# Patient Record
Sex: Female | Born: 2000 | Race: Black or African American | Hispanic: No | Marital: Single | Smoking: Never smoker
Health system: Southern US, Community
[De-identification: ages and names within clinical notes are randomized; demographics above are authoritative.]

## PROBLEM LIST (undated history)

## (undated) DIAGNOSIS — J45909 Unspecified asthma, uncomplicated: Secondary | ICD-10-CM

## (undated) DIAGNOSIS — E282 Polycystic ovarian syndrome: Secondary | ICD-10-CM

## (undated) HISTORY — DX: Unspecified asthma, uncomplicated: J45.909

---
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
## 2018-01-02 ENCOUNTER — Ambulatory Visit: Payer: Self-pay | Admitting: Pediatrics

## 2018-01-10 ENCOUNTER — Encounter: Payer: Self-pay | Admitting: Pediatrics

## 2018-01-10 ENCOUNTER — Ambulatory Visit (INDEPENDENT_AMBULATORY_CARE_PROVIDER_SITE_OTHER): Payer: Medicaid Other | Admitting: Pediatrics

## 2018-01-10 VITALS — BP 115/75 | HR 67 | Ht 64.17 in | Wt 172.4 lb

## 2018-01-10 DIAGNOSIS — N946 Dysmenorrhea, unspecified: Secondary | ICD-10-CM | POA: Diagnosis not present

## 2018-01-10 DIAGNOSIS — L83 Acanthosis nigricans: Secondary | ICD-10-CM

## 2018-01-10 DIAGNOSIS — A749 Chlamydial infection, unspecified: Secondary | ICD-10-CM

## 2018-01-10 DIAGNOSIS — E282 Polycystic ovarian syndrome: Secondary | ICD-10-CM

## 2018-01-10 DIAGNOSIS — Z3202 Encounter for pregnancy test, result negative: Secondary | ICD-10-CM

## 2018-01-10 DIAGNOSIS — Z113 Encounter for screening for infections with a predominantly sexual mode of transmission: Secondary | ICD-10-CM

## 2018-01-10 LAB — POCT URINE PREGNANCY: PREG TEST UR: NEGATIVE

## 2018-01-10 MED ORDER — NORETHIN-ETH ESTRAD-FE BIPHAS 1 MG-10 MCG / 10 MCG PO TABS: 1.0000 | ORAL_TABLET | Freq: Every day | ORAL | 11 refills | Status: DC

## 2018-01-10 NOTE — Patient Instructions (Addendum)
Pick up vitamin D 2,000 IU daily  Restart birth control pill  I will send results to you   [email or web address]

## 2018-01-10 NOTE — Progress Notes (Signed)
History was provided by the patient.  Kristin Haynes is a 17 y.o. female who is here for .  Ettefagh, Aron Baba, MD   HPI:  Pt reports that she stopped taking the birth control because it was making her sad and cry a lot. The episodes stopped once she stopped birth control. It was all the time. LMP- had two in July- they were shorter than normal but closer together than normal as well. They were pretty heavy in the beginning. Cramping was still the same but tolerable with advil.   Did not pick up lovaza.   Finished vitamin D about 2 weeks ago.   Feels that she had eczema on her lips and after she took the vitamin D pills she feels it went away.   Sexually active. Used condom with last partner but he reports he also has chlamydia. She would like to be screened today and take medication if she needs it vs. Being treated in clinic today. She is asymptomatic.   Patient's last menstrual period was 01/01/2018.  Review of Systems  Constitutional: Negative for malaise/fatigue.  Eyes: Negative for double vision.  Respiratory: Negative for shortness of breath.   Cardiovascular: Negative for chest pain and palpitations.  Gastrointestinal: Negative for abdominal pain, constipation, diarrhea, nausea and vomiting.  Genitourinary: Negative for dysuria.  Musculoskeletal: Negative for joint pain and myalgias.  Skin: Negative for rash.  Neurological: Negative for dizziness and headaches.  Endo/Heme/Allergies: Does not bruise/bleed easily.    Patient Active Problem List   Diagnosis Date Noted  . Dysmenorrhea 10/05/2017  . Abdominal pain 03/31/2016  . Hyperlipidemia 01/06/2016  . Vitamin D deficiency 07/20/2015  . PCOS (polycystic ovarian syndrome) 07/18/2015  . Tension headache 05/03/2015  . Migraine without aura and without status migrainosus, not intractable 05/03/2015  . 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 Rhinitis, allergic 04/30/2014  . Mild intermittent asthma  04/30/2014  . Acanthosis nigricans 04/30/2014    Current Outpatient Medications on File Prior to Visit  Medication Sig Dispense Refill  . albuterol (PROVENTIL HFA;VENTOLIN HFA) 108 (90 Base) MCG/ACT inhaler Inhale 2 puffs into the lungs every 6 (six) hours as needed for wheezing or shortness of breath. 1 Inhaler 1  . cetirizine (ZYRTEC) 10 MG tablet Take 1 tablet (10 mg total) by mouth daily. 30 tablet 11  . ibuprofen (ADVIL,MOTRIN) 600 MG tablet TAKE 1 TABLET (600 MG TOTAL) BY MOUTH EVERY 6 (SIX) HOURS AS NEEDED FOR HEADACHE.  2   No current facility-administered medications on file prior to visit.     Allergies  Allergen Reactions  . Other     Seasonal Allergies       Social History: Confidentiality was discussed with the patient and if applicable, with caregiver as well. Tobacco: none Secondhand smoke exposure? no Drugs/EtOH: none Sexually active? yes - as above. Restarting OCP today.   Safety: safe at home and school  Last STI Screening: today Pregnancy Prevention: restart ocp and condoms   Physical Exam:    Vitals:   01/10/18 1556  BP: 115/75  Pulse: 67  Weight: 172 lb 6.4 oz (78.2 kg)  Height: 5' 4.17" (1.63 m)    Blood pressure percentiles are not available for patients who are 18 years or older.  Physical Exam  Constitutional: She is oriented to person, place, and time. She appears well-developed and well-nourished.  HENT:  Head: Normocephalic.  Neck: No thyromegaly present.  Cardiovascular: Normal rate, regular rhythm, normal heart sounds and intact distal pulses.  Pulmonary/Chest: Effort normal and breath sounds normal.  Abdominal: Soft. Bowel sounds are normal. There is no tenderness.  Musculoskeletal: Normal range of motion.  Neurological: She is alert and oriented to person, place, and time.  Skin: Skin is warm and dry.  Psychiatric: She has a normal mood and affect.    Assessment/Plan: 1. PCOS (polycystic ovarian syndrome) Not due for labs but  does need to take vit D daily. Discussed need for continued exercise and low fat diet with lipids. Does not want to take fish oil at this time.   2. Dysmenorrhea Elects to restart ocp despite past issues with abdominal pain. We will try lo loestrin. Based on manufacturer recommendations she meets weight criteria for efficacy. Discussed possible use of LARC vs. Patch/ring. She as interested in IUD and will let us know if she would like one.   3. Acanthosis nigricans Improving.   4. Routine screening for STI (sexually transmitted infection) Per protocol.  - C. trachomatis/N. gonorrhoeae RNA  5. Pregnancy examination or test, negative result Per protocol.  - POCT urine pregnancy  6. Chlamydia Chlamydia returnd positive. Patient treated. Declined EPT.  - azithromycin (ZITHROMAX) 500 MG tablet; Take 2 tablets (1,000 mg total) by mouth once for 1 dose.  Dispense: 2 tablet; Refill: 0   PCOS Labs & Referrals:   - Hgba1c annually if normal, every 3 months if abnormal:  Due 06/2018 - CMP annually if normal, as needed if abnormal:  Due 06/2018 - CBC if on metformin, annually if normal, as needed if abnormal: No metformin - Lipid every 2 years if normal, annually if abnormal:  Due 06/2018 - Vitamin D annually if normal, as needed if abnormal: Due 06/2018 - Nutrition referral: declined - BH Screening: Due next visit

## 2018-01-11 ENCOUNTER — Encounter: Payer: Self-pay | Admitting: Pediatrics

## 2018-01-11 LAB — C. TRACHOMATIS/N. GONORRHOEAE RNA
C. TRACHOMATIS RNA, TMA: DETECTED — AB
N. gonorrhoeae RNA, TMA: NOT DETECTED

## 2018-01-11 MED ORDER — AZITHROMYCIN 500 MG PO TABS: 1000.0000 mg | ORAL_TABLET | Freq: Once | ORAL | 0 refills | Status: AC

## 2018-02-13 ENCOUNTER — Ambulatory Visit (INDEPENDENT_AMBULATORY_CARE_PROVIDER_SITE_OTHER): Payer: Medicaid Other | Admitting: Family

## 2018-02-13 ENCOUNTER — Encounter: Payer: Self-pay | Admitting: Family

## 2018-02-13 VITALS — BP 117/89 | HR 56 | Ht 64.0 in | Wt 172.0 lb

## 2018-02-13 DIAGNOSIS — Z113 Encounter for screening for infections with a predominantly sexual mode of transmission: Secondary | ICD-10-CM

## 2018-02-13 NOTE — Progress Notes (Signed)
History was provided by the patient.  Kristin Haynes is a 17 y.o. female who is here for STI screening.   PCP confirmed? Yes.    Ettefagh, Aron Baba, MD  HPI:   -desires STI screening for test for reinfection. She was treated at last OV 01/10/18  -takes loestrin since 01/10/18 visit -not interested in IUD or LARC today, just wants screening  -no unexplained bleeding, no pain with intercourse -no lesions, no discharge changes   Review of Systems  Constitutional: Negative for malaise/fatigue.  Eyes: Negative for double vision.  Respiratory: Negative for shortness of breath.   Cardiovascular: Negative for chest pain and palpitations.  Gastrointestinal: Negative for abdominal pain, constipation, diarrhea, nausea and vomiting.  Genitourinary: Negative for dysuria.  Musculoskeletal: Negative for joint pain and myalgias.  Skin: Negative for rash.  Neurological: Negative for dizziness and headaches.  Endo/Heme/Allergies: Does not bruise/bleed easily.      Patient Active Problem List   Diagnosis Date Noted  . Dysmenorrhea 10/05/2017  . Abdominal pain 03/31/2016  . Hyperlipidemia 01/06/2016  . Vitamin D deficiency 07/20/2015  . PCOS (polycystic ovarian syndrome) 07/18/2015  . Tension headache 05/03/2015  . Migraine without aura and without status migrainosus, not intractable 05/03/2015  . 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 Rhinitis, allergic 04/30/2014  . Mild intermittent asthma 04/30/2014  . Acanthosis nigricans 04/30/2014    Current Outpatient Medications on File Prior to Visit  Medication Sig Dispense Refill  . albuterol (PROVENTIL HFA;VENTOLIN HFA) 108 (90 Base) MCG/ACT inhaler Inhale 2 puffs into the lungs every 6 (six) hours as needed for wheezing or shortness of breath. 1 Inhaler 1  . cetirizine (ZYRTEC) 10 MG tablet Take 1 tablet (10 mg total) by mouth daily. 30 tablet 11  . Norethindrone-Ethinyl Estradiol-Fe Biphas (LO LOESTRIN FE) 1 MG-10 MCG / 10  MCG tablet Take 1 tablet by mouth daily. 1 Package 11  . ibuprofen (ADVIL,MOTRIN) 600 MG tablet TAKE 1 TABLET (600 MG TOTAL) BY MOUTH EVERY 6 (SIX) HOURS AS NEEDED FOR HEADACHE.  2   No current facility-administered medications on file prior to visit.     Allergies  Allergen Reactions  . Other     Seasonal Allergies       Physical Exam:    Vitals:   02/13/18 0959  BP: 117/89  Pulse: (!) 56  Weight: 172 lb (78 kg)  Height: 5\' 4"  (1.626 m)   Wt Readings from Last 3 Encounters:  02/13/18 172 lb (78 kg) (92 %, Z= 1.43)*  01/10/18 172 lb 6.4 oz (78.2 kg) (93 %, Z= 1.45)*  10/03/17 178 lb (80.7 kg) (94 %, Z= 1.58)*   * Growth percentiles are based on CDC (Girls, 2-20 Years) data.    Blood pressure percentiles are not available for patients who are 18 years or older. No LMP recorded.  Physical Exam  Constitutional: She appears well-developed and well-nourished. No distress.  Eyes: Pupils are equal, round, and reactive to light. EOM are normal. No scleral icterus.  Neck: Normal range of motion. No thyromegaly present.  Cardiovascular: Normal rate and regular rhythm.  No murmur heard. Pulmonary/Chest: Effort normal.  Abdominal: Soft. There is no tenderness. There is no guarding.  Musculoskeletal: Normal range of motion. She exhibits no edema or tenderness.  Neurological: She is alert.  Skin: Skin is warm and dry. No rash noted.  Psychiatric: She has a normal mood and affect.  Nursing note and vitals reviewed.   Assessment/Plan: 1. Routine screening for STI (sexually  transmitted infection) testing today, will call with results  Condom use reviewed Also reviewed EC - GC Probe amplification, urine - RPR - HIV antibody

## 2018-02-13 NOTE — Patient Instructions (Signed)
I will call you with results from today's test.  Keep your scheduled appointment! It was nice to see you.

## 2018-02-14 LAB — HIV ANTIBODY (ROUTINE TESTING W REFLEX): HIV: NONREACTIVE

## 2018-02-14 LAB — RPR: RPR Ser Ql: NONREACTIVE

## 2018-02-16 ENCOUNTER — Encounter: Payer: Self-pay | Admitting: Family

## 2018-02-19 ENCOUNTER — Ambulatory Visit (INDEPENDENT_AMBULATORY_CARE_PROVIDER_SITE_OTHER): Payer: Medicaid Other

## 2018-02-19 DIAGNOSIS — Z113 Encounter for screening for infections with a predominantly sexual mode of transmission: Secondary | ICD-10-CM | POA: Diagnosis not present

## 2018-02-19 NOTE — Progress Notes (Signed)
Patient came to clinic for Gc/Chl urine specimen recollect. Patient made aware we will call when results are available.

## 2018-02-20 LAB — C. TRACHOMATIS/N. GONORRHOEAE RNA
C. trachomatis RNA, TMA: NOT DETECTED
N. gonorrhoeae RNA, TMA: NOT DETECTED

## 2018-03-04 ENCOUNTER — Encounter: Payer: Self-pay | Admitting: Family

## 2018-03-28 ENCOUNTER — Encounter: Payer: Self-pay | Admitting: Family

## 2018-04-01 ENCOUNTER — Encounter: Payer: Self-pay | Admitting: Pediatrics

## 2018-04-01 ENCOUNTER — Ambulatory Visit (INDEPENDENT_AMBULATORY_CARE_PROVIDER_SITE_OTHER): Payer: Medicaid Other | Admitting: Pediatrics

## 2018-04-01 VITALS — BP 122/74 | HR 67 | Ht 64.57 in | Wt 181.0 lb

## 2018-04-01 DIAGNOSIS — N644 Mastodynia: Secondary | ICD-10-CM

## 2018-04-01 DIAGNOSIS — G43009 Migraine without aura, not intractable, without status migrainosus: Secondary | ICD-10-CM

## 2018-04-01 DIAGNOSIS — N946 Dysmenorrhea, unspecified: Secondary | ICD-10-CM

## 2018-04-01 DIAGNOSIS — H538 Other visual disturbances: Secondary | ICD-10-CM

## 2018-04-01 DIAGNOSIS — E282 Polycystic ovarian syndrome: Secondary | ICD-10-CM

## 2018-04-01 DIAGNOSIS — N898 Other specified noninflammatory disorders of vagina: Secondary | ICD-10-CM

## 2018-04-01 DIAGNOSIS — Z113 Encounter for screening for infections with a predominantly sexual mode of transmission: Secondary | ICD-10-CM

## 2018-04-01 MED ORDER — NAPROXEN 500 MG PO TBEC: 500.0000 mg | DELAYED_RELEASE_TABLET | Freq: Two times a day (BID) | ORAL | 0 refills | Status: DC

## 2018-04-01 MED ORDER — CYCLOBENZAPRINE HCL 5 MG PO TABS: 5.0000 mg | ORAL_TABLET | Freq: Every day | ORAL | 0 refills | Status: DC

## 2018-04-01 NOTE — Patient Instructions (Signed)
Start taking naproxen 500 mg twice daily with food  Take flexeril 5 mg at bedtime One lab today  I will re-send you to adult neurology   If you have any loss of consciousness, weakness, difficulty with bowel or bladder, blindness in one eye, etc. Please go to the nearest ER

## 2018-04-01 NOTE — Progress Notes (Signed)
History was provided by the patient.  Kristin Haynes is a 17 y.o. female who is here for vaginal symptoms, headaches, breast pain.  Ettefagh, Aron Baba, MD   HPI:  Pt reports that she has a really bad migraine right now. She has been having them very frequently for hte last few weeks. She has not been sexually active since the last time she was here but the last time she had sex she had burning and some external pain. Continues to have regular discharge but an odor that is different. No pain with urination. Denies itching.   Bilateral breast pain. Didn't have any yesterday. Yesterday would have been a total of 5 days of it. It is like a zap of pain. She has never felt the sharp breast pain previously. None today. No discharge from nipples. No change in the skin.   Headaches are frontal behind eyes but mostly on the right side of her head. It can last all day and go to sleep and still wake up with it. Has some nausea but no vomiting. Feels like she needs to lay in the dark all day. Has blurry vision- this is new for her and only during parts of the day. Headache has been for about 1 week. Feels like she is hydrating well. She does not eat breakfast but otherwise doesn't skip meals. Goes to bed about 1-2 am and is up doing work on the computer. She has been awoken for two nights by twitching on the left side of her head. Nothing has made it better and it continues every few minutes. Just started drinking coffee which does help some. Has also had some other twitches around her body which is similar to the one in her head.   Has tried ibuprofen- 800 mg will stop the headache for a few hours but then it returns.   Patient's last menstrual period was 03/12/2018.  Review of Systems  Constitutional: Negative for malaise/fatigue.  HENT: Negative for hearing loss, sore throat and tinnitus.   Eyes: Positive for blurred vision and photophobia. Negative for double vision.  Respiratory: Negative for  shortness of breath.   Cardiovascular: Negative for chest pain and palpitations.  Gastrointestinal: Positive for nausea. Negative for abdominal pain, constipation, diarrhea and vomiting.  Genitourinary: Negative for dysuria.  Musculoskeletal: Negative for joint pain and myalgias.  Skin: Negative for rash.  Neurological: Positive for headaches. Negative for dizziness.  Endo/Heme/Allergies: Does not bruise/bleed easily.  Psychiatric/Behavioral: Negative for depression. The patient is not nervous/anxious.     Patient Active Problem List   Diagnosis Date Noted  . Dysmenorrhea 10/05/2017  . Abdominal pain 03/31/2016  . Hyperlipidemia 01/06/2016  . Vitamin D deficiency 07/20/2015  . PCOS (polycystic ovarian syndrome) 07/18/2015  . Tension headache 05/03/2015  . Migraine without aura and without status migrainosus, not intractable 05/03/2015  . 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 Rhinitis, allergic 04/30/2014  . Mild intermittent asthma 04/30/2014  . Acanthosis nigricans 04/30/2014    Current Outpatient Medications on File Prior to Visit  Medication Sig Dispense Refill  . albuterol (PROVENTIL HFA;VENTOLIN HFA) 108 (90 Base) MCG/ACT inhaler Inhale 2 puffs into the lungs every 6 (six) hours as needed for wheezing or shortness of breath. 1 Inhaler 1  . Norethindrone-Ethinyl Estradiol-Fe Biphas (LO LOESTRIN FE) 1 MG-10 MCG / 10 MCG tablet Take 1 tablet by mouth daily. 1 Package 11  . ibuprofen (ADVIL,MOTRIN) 600 MG tablet TAKE 1 TABLET (600 MG TOTAL) BY MOUTH EVERY 6 (SIX)  HOURS AS NEEDED FOR HEADACHE.  2   No current facility-administered medications on file prior to visit.     Allergies  Allergen Reactions  . Other     Seasonal Allergies       Social History: Confidentiality was discussed with the patient and if applicable, with caregiver as well. Tobacco: none Secondhand smoke exposure? no Drugs/EtOH: none Sexually active? yes  Safety: safe to self and at  home Last STI Screening: today Pregnancy Prevention: OCP  Physical Exam:    Vitals:   04/01/18 1437  BP: 122/74  Pulse: 67  Weight: 181 lb (82.1 kg)  Height: 5' 4.57" (1.64 m)    Blood pressure percentiles are not available for patients who are 18 years or older.  Physical Exam  Constitutional: She is oriented to person, place, and time. She appears well-developed. No distress.  HENT:  Mouth/Throat: Oropharynx is clear and moist.  Eyes: Pupils are equal, round, and reactive to light. EOM are normal.  Neck: No thyromegaly present.  Cardiovascular: Normal rate and regular rhythm.  No murmur heard. Pulmonary/Chest: Breath sounds normal.  Abdominal: Soft. She exhibits no mass. There is no tenderness. There is no guarding.  Musculoskeletal: She exhibits no edema.  Lymphadenopathy:    She has no cervical adenopathy.  Neurological: She is alert and oriented to person, place, and time. No cranial nerve deficit or sensory deficit. Coordination normal.  Skin: Skin is warm. No rash noted.  Psychiatric: She has a normal mood and affect.  Nursing note and vitals reviewed.   Assessment/Plan: 1. PCOS (polycystic ovarian syndrome) Doing well on OCP.   2. Dysmenorrhea Still having some cramping but overall ok with OCP as it is now. Does not desire a LARC at this time.   3. Migraine without aura and without status migrainosus, not intractable Given breast pain and headaches will check prolactin. Muscle fasciculations are likely benign and possibly stress related. She will take 5 mg flexeril at bedtime to help with muscle relaxation to get good sleep and hopefully break headache with naproxen 500 mg BID. She saw neuro 3 years ago- now is 2 so will re-refer to adult neuro given that migraines seem to have worsened. No overt need for imaging today.  - Prolactin - naproxen (EC NAPROSYN) 500 MG EC tablet; Take 1 tablet (500 mg total) by mouth 2 (two) times daily with a meal.  Dispense: 60  tablet; Refill: 0 - cyclobenzaprine (FLEXERIL) 5 MG tablet; Take 1 tablet (5 mg total) by mouth at bedtime.  Dispense: 15 tablet; Refill: 0 - Ambulatory referral to Neurology  4. Breast pain Expect related to menstrual cycle. Discussed alarming things like skin changes, lumps or nipple discharge. Did not do full exam today given that we were drawing prolactin.  - Prolactin  5. Vaginal discharge Will treat accordingly.  - WET PREP BY MOLECULAR PROBE  6. Routine screening for STI (sexually transmitted infection) Given new partner and sx, will screen  - C. trachomatis/N. gonorrhoeae RNA  7. Blurry vision, right eye Likely migraine related, but neuro should see her again to help eval further. Discussed ED return precautions.  - Prolactin - Ambulatory referral to Neurology

## 2018-04-02 ENCOUNTER — Other Ambulatory Visit: Payer: Self-pay | Admitting: Pediatrics

## 2018-04-02 DIAGNOSIS — B9689 Other specified bacterial agents as the cause of diseases classified elsewhere: Secondary | ICD-10-CM

## 2018-04-02 DIAGNOSIS — N760 Acute vaginitis: Principal | ICD-10-CM

## 2018-04-02 LAB — WET PREP BY MOLECULAR PROBE
CANDIDA SPECIES: NOT DETECTED
MICRO NUMBER:: 91262338
SPECIMEN QUALITY:: ADEQUATE
Trichomonas vaginosis: NOT DETECTED

## 2018-04-02 LAB — C. TRACHOMATIS/N. GONORRHOEAE RNA
C. trachomatis RNA, TMA: NOT DETECTED
N. GONORRHOEAE RNA, TMA: NOT DETECTED

## 2018-04-02 LAB — PROLACTIN: Prolactin: 9 ng/mL

## 2018-04-02 MED ORDER — METRONIDAZOLE 500 MG PO TABS: 500.0000 mg | ORAL_TABLET | Freq: Two times a day (BID) | ORAL | 0 refills | Status: AC

## 2018-04-17 ENCOUNTER — Ambulatory Visit: Payer: Self-pay | Admitting: Pediatrics

## 2018-04-24 ENCOUNTER — Ambulatory Visit (INDEPENDENT_AMBULATORY_CARE_PROVIDER_SITE_OTHER): Payer: Medicaid Other | Admitting: Family

## 2018-04-24 ENCOUNTER — Other Ambulatory Visit: Payer: Self-pay | Admitting: Pediatrics

## 2018-04-24 VITALS — BP 114/76 | HR 60 | Ht 64.17 in | Wt 179.4 lb

## 2018-04-24 DIAGNOSIS — G43009 Migraine without aura, not intractable, without status migrainosus: Secondary | ICD-10-CM | POA: Diagnosis not present

## 2018-04-24 DIAGNOSIS — E282 Polycystic ovarian syndrome: Secondary | ICD-10-CM

## 2018-04-24 DIAGNOSIS — H538 Other visual disturbances: Secondary | ICD-10-CM | POA: Diagnosis not present

## 2018-04-24 DIAGNOSIS — Z3202 Encounter for pregnancy test, result negative: Secondary | ICD-10-CM | POA: Diagnosis not present

## 2018-04-24 LAB — POCT URINE PREGNANCY: PREG TEST UR: NEGATIVE

## 2018-04-24 NOTE — Patient Instructions (Signed)
You are constipated and need help to clean out the large amount of stool (poop) in the intestine. This guide tells you what medicine to use.  What do I need to know before starting the clean out?  . It will take about 4 to 6 hours to take the medicine.  . After taking the medicine, you should have a large stool within 24 hours.  . Plan to stay close to a bathroom until the stool has passed. . After the intestine is cleaned out, you will need to take a daily medicine.   Remember:  Constipation can last a long time. It may take 6 to 12 months for you to get back to regular bowel movements (BMs). Be patient. Things will get better slowly over time.  If you have questions, call your doctor at this number:     [phone]   When should you start the clean out?  . Start the home clean out on a Friday afternoon or some other time when you will be home (and not at school).  . Start between 2:00 and 4:00 in the afternoon.  . You should have almost clear liquid stools by the end of the next day. . If the medicine does not work or you don't know if it worked, call your doctor or nurse.  What medicine do I need to take?  You need to take Miralax, a powder that you mix in a clear liquid.  Follow these steps: ?    Stir the Miralax powder into water, juice, or Gatorade. Your Miralax dose is: 8 capfuls of Miralax powder in 32 ounces of liquid ?    Drink 4 to 8 ounces every 30 minutes. It will take 4 to 6 hours to finish the medicine. ?    After the medicine is gone, drink more water or juice. This will help with the cleanout.   -     If the medicine gives you an upset stomach, slow down or stop.   Does I need to keep taking medicine?                                                                                                      After the clean out, you will take a daily (maintenance) medicine for at least 6 months. Your Miralax dose is:      1 capful of powder in 8 ounces of liquid  every day   You should go to the doctor for follow-up appointments as directed.  What if I get constipated again?  Some people need to have the clean out more than one time for the problem to go away. Contact your doctor to ask if you should repeat the clean out. It is OK to do it again, but you should wait at least a week before repeating the clean out.    Will I have any problems with the medicine?   You may have stomach pain or cramping during the clean out. This might mean you have to go to the bathroom.     Take some time to sit on the toilet. The pain will go away when the stool is gone. You may want to read while you wait. A warm bath may also help.   What should I eat and drink?  Drink lots of water and juice. Fruits and vegetables are good foods to eat. Try to avoid greasy and fatty foods.   

## 2018-04-24 NOTE — Progress Notes (Signed)
History was provided by the patient.  Kristin Haynes is a 17 y.o. female who is here for follow up on dysmenorrhea, headaches, PCOS, and blurry vision.  PCP confirmed? Yes.    Ettefagh, Aron BabaKate Scott, MD  HPI:   -prolactin level was normal after last OV -she was referred to neuro for migraines, mostly R eye blurry -still having symptoms, headaches.  -she cannot recall last vision screening  -wants to keep taking OCPs and naprosyn for cramps   Review of Systems  Constitutional: Negative for chills and fever.  HENT: Negative for sore throat.   Eyes: Positive for blurred vision. Negative for pain.  Respiratory: Negative for cough.   Gastrointestinal: Negative for abdominal pain and vomiting.  Genitourinary: Negative for dysuria and frequency.  Musculoskeletal: Negative for myalgias.  Skin: Negative for rash.  Neurological: Positive for headaches. Negative for dizziness, tingling and tremors.  Psychiatric/Behavioral: Negative for depression, hallucinations, substance abuse and suicidal ideas. The patient is not nervous/anxious.      Patient Active Problem List   Diagnosis Date Noted  . Dysmenorrhea 10/05/2017  . Abdominal pain 03/31/2016  . Hyperlipidemia 01/06/2016  . Vitamin D deficiency 07/20/2015  . PCOS (polycystic ovarian syndrome) 07/18/2015  . Tension headache 05/03/2015  . Migraine without aura and without status migrainosus, not intractable 05/03/2015  . 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 Rhinitis, allergic 04/30/2014  . Mild intermittent asthma 04/30/2014  . Acanthosis nigricans 04/30/2014    Current Outpatient Medications on File Prior to Visit  Medication Sig Dispense Refill  . albuterol (PROVENTIL HFA;VENTOLIN HFA) 108 (90 Base) MCG/ACT inhaler Inhale 2 puffs into the lungs every 6 (six) hours as needed for wheezing or shortness of breath. 1 Inhaler 1  . Norethindrone-Ethinyl Estradiol-Fe Biphas (LO LOESTRIN FE) 1 MG-10 MCG / 10 MCG tablet Take  1 tablet by mouth daily. 1 Package 11  . cyclobenzaprine (FLEXERIL) 5 MG tablet Take 1 tablet (5 mg total) by mouth at bedtime. (Patient not taking: Reported on 04/24/2018) 15 tablet 0  . ibuprofen (ADVIL,MOTRIN) 600 MG tablet TAKE 1 TABLET (600 MG TOTAL) BY MOUTH EVERY 6 (SIX) HOURS AS NEEDED FOR HEADACHE.  2   No current facility-administered medications on file prior to visit.     Allergies  Allergen Reactions  . Other     Seasonal Allergies       Physical Exam:    Vitals:   04/24/18 1351  BP: 114/76  Pulse: 60  Weight: 179 lb 6.4 oz (81.4 kg)  Height: 5' 4.17" (1.63 m)   Wt Readings from Last 3 Encounters:  04/24/18 179 lb 6.4 oz (81.4 kg) (94 %, Z= 1.58)*  04/01/18 181 lb (82.1 kg) (95 %, Z= 1.62)*  02/13/18 172 lb (78 kg) (92 %, Z= 1.43)*   * Growth percentiles are based on CDC (Girls, 2-20 Years) data.     Blood pressure percentiles are not available for patients who are 18 years or older. No LMP recorded.  Physical Exam Vitals signs and nursing note reviewed.  Constitutional:      General: She is not in acute distress.    Appearance: She is well-developed.  HENT:     Mouth/Throat:     Mouth: Mucous membranes are moist.     Pharynx: No posterior oropharyngeal erythema.  Eyes:     General: No scleral icterus.       Right eye: No discharge.        Left eye: No discharge.  Extraocular Movements: Extraocular movements intact.     Conjunctiva/sclera: Conjunctivae normal.     Pupils: Pupils are equal, round, and reactive to light.  Neck:     Musculoskeletal: No neck rigidity.     Thyroid: No thyromegaly.     Vascular: No carotid bruit.  Cardiovascular:     Rate and Rhythm: Normal rate and regular rhythm.     Heart sounds: No murmur.  Pulmonary:     Breath sounds: Normal breath sounds.  Lymphadenopathy:     Cervical: No cervical adenopathy.  Skin:    General: Skin is warm.     Capillary Refill: Capillary refill takes 2 to 3 seconds.     Findings:  No rash.  Neurological:     General: No focal deficit present.     Mental Status: She is alert and oriented to person, place, and time.  Psychiatric:        Mood and Affect: Mood normal.     Assessment/Plan: 1. Blurry vision, bilateral -could be vision related headaches, will send to ophthalmology for exam -reassurance given re: normal prolactin level   - Ambulatory referral to Ophthalmology  2. Migraine without aura and without status migrainosus, not intractable -continue with neuro scheduled for 12/17   3. PCOS (polycystic ovarian syndrome) -continue on current regimen of Lo Loestrin and Naproxen EC 500 mg BID with meals for cramps -cautioned NSAID use and rebound headaches  4. Negative pregnancy test Negative  - POCT urine pregnancy

## 2018-05-26 ENCOUNTER — Encounter: Payer: Self-pay | Admitting: Family

## 2018-05-28 ENCOUNTER — Ambulatory Visit: Payer: Medicaid Other | Admitting: Neurology

## 2018-06-18 ENCOUNTER — Other Ambulatory Visit: Payer: Self-pay | Admitting: Family

## 2018-06-18 MED ORDER — NORETHIN-ETH ESTRAD-FE BIPHAS 1 MG-10 MCG / 10 MCG PO TABS: 1.0000 | ORAL_TABLET | Freq: Every day | ORAL | 11 refills | Status: DC

## 2018-07-22 ENCOUNTER — Ambulatory Visit: Payer: Medicaid Other | Admitting: Neurology

## 2018-07-22 ENCOUNTER — Telehealth: Payer: Self-pay | Admitting: *Deleted

## 2018-07-22 NOTE — Telephone Encounter (Signed)
No showed new patient appointment. 

## 2018-07-23 ENCOUNTER — Encounter: Payer: Self-pay | Admitting: Neurology

## 2018-08-22 DIAGNOSIS — G43009 Migraine without aura, not intractable, without status migrainosus: Secondary | ICD-10-CM

## 2018-08-23 MED ORDER — CYCLOBENZAPRINE HCL 5 MG PO TABS: 5.0000 mg | ORAL_TABLET | Freq: Every day | ORAL | 0 refills | Status: DC

## 2018-09-15 ENCOUNTER — Other Ambulatory Visit: Payer: Self-pay | Admitting: Pediatrics

## 2018-09-15 DIAGNOSIS — G43009 Migraine without aura, not intractable, without status migrainosus: Secondary | ICD-10-CM

## 2019-03-11 ENCOUNTER — Telehealth: Payer: Self-pay

## 2019-03-11 NOTE — Telephone Encounter (Signed)
Pt called asking for refill of BV medication, Flexeril, and birth control. Called number on file, no answer, left VM to call office back to schedule follow up. Also re-sent mychart activation text for patient.

## 2019-04-14 ENCOUNTER — Emergency Department (HOSPITAL_COMMUNITY)
Admission: EM | Admit: 2019-04-14 | Discharge: 2019-04-14 | Disposition: A | Discharge status: Home / Self Care | Payer: Self-pay | Attending: Emergency Medicine | Admitting: Emergency Medicine

## 2019-04-14 ENCOUNTER — Other Ambulatory Visit: Payer: Self-pay

## 2019-04-14 ENCOUNTER — Encounter (HOSPITAL_COMMUNITY): Payer: Self-pay

## 2019-04-14 DIAGNOSIS — Z79899 Other long term (current) drug therapy: Secondary | ICD-10-CM | POA: Insufficient documentation

## 2019-04-14 DIAGNOSIS — J45909 Unspecified asthma, uncomplicated: Secondary | ICD-10-CM | POA: Insufficient documentation

## 2019-04-14 DIAGNOSIS — Z202 Contact with and (suspected) exposure to infections with a predominantly sexual mode of transmission: Secondary | ICD-10-CM | POA: Insufficient documentation

## 2019-04-14 LAB — URINALYSIS, ROUTINE W REFLEX MICROSCOPIC
Bilirubin Urine: NEGATIVE
Glucose, UA: NEGATIVE mg/dL
Ketones, ur: NEGATIVE mg/dL
Leukocytes,Ua: NEGATIVE
Nitrite: NEGATIVE
Protein, ur: NEGATIVE mg/dL
Specific Gravity, Urine: 1.016 (ref 1.005–1.030)
pH: 6 (ref 5.0–8.0)

## 2019-04-14 LAB — WET PREP, GENITAL
Sperm: NONE SEEN
Trich, Wet Prep: NONE SEEN
Yeast Wet Prep HPF POC: NONE SEEN

## 2019-04-14 LAB — POC URINE PREG, ED: Preg Test, Ur: NEGATIVE

## 2019-04-14 MED ORDER — AZITHROMYCIN 250 MG PO TABS
1000.0000 mg | ORAL_TABLET | Freq: Once | ORAL | Status: AC
  Administered 2019-04-14: 1000 mg via ORAL
  Filled 2019-04-14: qty 4

## 2019-04-14 MED ORDER — LIDOCAINE HCL (PF) 1 % IJ SOLN
INTRAMUSCULAR | Status: AC
  Administered 2019-04-14: 1 mL
  Filled 2019-04-14: qty 2

## 2019-04-14 MED ORDER — CEFTRIAXONE SODIUM 250 MG IJ SOLR
250.0000 mg | Freq: Once | INTRAMUSCULAR | Status: AC
  Administered 2019-04-14: 250 mg via INTRAMUSCULAR
  Filled 2019-04-14: qty 250

## 2019-04-14 NOTE — ED Triage Notes (Addendum)
Pt reports had sex approx 1 week ago and wants to be checked for STD and pregnancy.   Reports had some spotting recently.

## 2019-04-14 NOTE — Discharge Instructions (Addendum)
Some of your culture results are still pending.  You will be notified of any positive results.  You may also review your results in mychart.  You may follow-up with the health department or [facility] if needed.

## 2019-04-14 NOTE — ED Provider Notes (Signed)
[facility] EMERGENCY DEPARTMENT Provider Note   CSN: [id number] Arrival date & time: 04/14/19  1022     History   Chief Complaint Chief Complaint  Patient presents with  . S74.5    HPI Kristin Haynes is a 18 y.o. female.     HPI   Kristin Haynes is a 18 y.o. female who presents to the Emergency Department requesting evaluation for possible STD.  She reports having unprotected sex 1 week ago with a new sexual partner.  She reports some intermittent vaginal spotting and vaginal odor.  No excessive discharge, dysuria, abdominal pain, fever or chills.  She is not taken an over-the-counter pregnancy test, but is requesting to be evaluated for possible pregnancy as well.  Past Medical History:  Diagnosis Date  . Asthma    cough-variant asthma    Patient Active Problem List   Diagnosis Date Noted  . Dysmenorrhea 10/05/2017  . Abdominal pain 03/31/2016  . Hyperlipidemia 01/06/2016  . Vitamin D deficiency 07/20/2015  . PCOS (polycystic ovarian syndrome) 07/18/2015  . Tension headache 05/03/2015  . Migraine without aura and without status migrainosus, not intractable 05/03/2015  . 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 Rhinitis, allergic 04/30/2014  . Mild intermittent asthma 04/30/2014  . Acanthosis nigricans 04/30/2014    History reviewed. No pertinent surgical history.   OB History   No obstetric history on file.      Home Medications    Prior to Admission medications   Medication Sig Start Date End Date Taking? Authorizing Provider  albuterol (PROVENTIL HFA;VENTOLIN HFA) 108 (90 Base) MCG/ACT inhaler Inhale 2 puffs into the lungs every 6 (six) hours as needed for wheezing or shortness of breath. 07/16/15   Ettefagh, Aron Baba, MD  cyclobenzaprine (FLEXERIL) 5 MG tablet TAKE 1 TABLET BY MOUTH EVERYDAY AT BEDTIME 09/16/18   Alfonso Ramus T, FNP  ibuprofen (ADVIL,MOTRIN) 600 MG tablet TAKE 1 TABLET (600 MG TOTAL) BY MOUTH EVERY 6 (SIX) HOURS AS  NEEDED FOR HEADACHE. 03/18/16   [provider]  NAPROXEN DR 500 MG EC tablet TAKE 1 TABLET BY MOUTH TWICE A DAY WITH MEALS 04/24/18   Verneda Skill, FNP  Norethindrone-Ethinyl Estradiol-Fe Biphas (LO LOESTRIN FE) 1 MG-10 MCG / 10 MCG tablet Take 1 tablet by mouth daily. 06/18/18   Georges Mouse, NP    Family History Family History  Problem Relation Age of Onset  . Hyperlipidemia Mother   . Migraines Mother   . Asthma Maternal Grandmother   . Diabetes Maternal Grandmother   . Heart disease Maternal Grandmother        congestive heart failure  . Cancer Maternal Grandmother        breast cancer (negative testing for genetic causes)  . Heart disease Maternal Grandfather 42       MI  . Migraines Maternal Grandfather   . Asthma Sister   . Seizures Other        2 paternal 1st cousins have seizures, maternal 1st cousin had febrile seizures (resolved), MGU has seizures    Social History Social History   Tobacco Use  . Smoking status: Never Smoker  . Smokeless tobacco: Never Used  Substance Use Topics  . Alcohol use: No    Alcohol/week: 0.0 standard drinks  . Drug use: No     Allergies   Other   Review of Systems Review of Systems  Constitutional: Negative for activity change, appetite change, chills and fever.  Respiratory: Negative for chest tightness and  shortness of breath.   Cardiovascular: Negative for chest pain.  Gastrointestinal: Negative for abdominal pain, nausea and vomiting.  Genitourinary: Positive for urgency and vaginal bleeding ("Spotting"). Negative for decreased urine volume, difficulty urinating, dysuria, flank pain, frequency, genital sores, hematuria, vaginal discharge and vaginal pain.  Musculoskeletal: Negative for back pain.  Skin: Negative for rash.  Neurological: Negative for dizziness, weakness and numbness.  Hematological: Negative for adenopathy.     Physical Exam Updated Vital Signs BP 132/86 (BP Location: Right Arm)    Pulse (!) 58   Temp 98.4 F (36.9 C) (Oral)   Resp 18   Ht 5\' 4"  (1.626 m)   Wt 84.4 kg   LMP 03/18/2019   SpO2 100%   BMI 31.93 kg/m   Physical Exam Vitals signs and nursing note reviewed. Exam conducted with a chaperone present.  Constitutional:      Appearance: Normal appearance. She is not ill-appearing or toxic-appearing.  Cardiovascular:     Rate and Rhythm: Normal rate and regular rhythm.  Pulmonary:     Effort: Pulmonary effort is normal.     Breath sounds: Normal breath sounds.  Abdominal:     General: There is no distension.     Palpations: Abdomen is soft.     Tenderness: There is no abdominal tenderness.  Genitourinary:    Vagina: No vaginal discharge.     Cervix: No cervical motion tenderness, discharge, friability, lesion or cervical bleeding.     Uterus: Normal. Not tender.      Adnexa: Right adnexa normal and left adnexa normal.       Right: No mass or tenderness.         Left: No mass or tenderness.    Musculoskeletal: Normal range of motion.        General: No tenderness.  Skin:    General: Skin is warm.     Findings: No rash.  Neurological:     General: No focal deficit present.     Mental Status: She is alert.     Motor: No weakness.      ED Treatments / Results  Labs (all labs ordered are listed, but only abnormal results are displayed) Labs Reviewed  WET PREP, GENITAL - Abnormal; Notable for the following components:      Result Value   Clue Cells Wet Prep HPF POC PRESENT (*)    WBC, Wet Prep HPF POC MODERATE (*)    All other components within normal limits  URINALYSIS, ROUTINE W REFLEX MICROSCOPIC - Abnormal; Notable for the following components:   APPearance HAZY (*)    Hgb urine dipstick MODERATE (*)    Bacteria, UA MANY (*)    All other components within normal limits  POC URINE PREG, ED  GC/CHLAMYDIA PROBE AMP ([place]    EKG None  Radiology No results found.  Procedures Procedures (including critical  care time)  Medications Ordered in ED Medications  azithromycin (ZITHROMAX) tablet 1,000 mg (1,000 mg Oral Given 04/14/19 1215)  cefTRIAXone (ROCEPHIN) injection 250 mg (250 mg Intramuscular Given 04/14/19 1215)  lidocaine (PF) (XYLOCAINE) 1 % injection (1 mL  Given 04/14/19 1215)     Initial Impression / Assessment and Plan / ED Course  I have reviewed the triage vital signs and the nursing notes.  Pertinent labs & imaging results that were available during my care of the patient were reviewed by me and considered in my medical decision making (see chart for details).  Patient here for evaluation of possible STD exposure.  She is requesting prophylactic treatment.  Gonorrhea and Chlamydia cultures are pending.  No concerning symptoms for PID or TOA.  Patient appears appropriate for discharge home, treated here with Rocephin and Zithromax.  She understands that she will be contacted for any positive culture results.  F/u information provided for the health department.  Final Clinical Impressions(s) / ED Diagnoses   Final diagnoses:  STD exposure    ED Discharge Orders    None       Kem Parkinson, PA-C 04/16/19 1351    Nat Christen, MD 04/18/19 [phone]

## 2019-04-15 LAB — GC/CHLAMYDIA PROBE AMP (~~LOC~~) NOT AT ARMC
Chlamydia: NEGATIVE
Neisseria Gonorrhea: NEGATIVE

## 2019-05-05 ENCOUNTER — Other Ambulatory Visit: Payer: Self-pay

## 2019-05-05 DIAGNOSIS — G43009 Migraine without aura, not intractable, without status migrainosus: Secondary | ICD-10-CM

## 2019-05-06 MED ORDER — CYCLOBENZAPRINE HCL 5 MG PO TABS: 5.0000 mg | ORAL_TABLET | ORAL | 0 refills | Status: DC | PRN

## 2019-06-19 ENCOUNTER — Other Ambulatory Visit: Payer: Self-pay | Admitting: Pediatrics

## 2019-08-07 ENCOUNTER — Other Ambulatory Visit (HOSPITAL_COMMUNITY)
Admission: RE | Admit: 2019-08-07 | Discharge: 2019-08-07 | Disposition: A | Discharge status: Home / Self Care | Payer: Self-pay | Source: Ambulatory Visit | Attending: Family | Admitting: Family

## 2019-08-07 ENCOUNTER — Ambulatory Visit (INDEPENDENT_AMBULATORY_CARE_PROVIDER_SITE_OTHER): Payer: Self-pay

## 2019-08-07 ENCOUNTER — Other Ambulatory Visit: Payer: Self-pay

## 2019-08-07 ENCOUNTER — Other Ambulatory Visit: Payer: Self-pay | Admitting: Family

## 2019-08-07 DIAGNOSIS — M62838 Other muscle spasm: Secondary | ICD-10-CM

## 2019-08-07 DIAGNOSIS — N898 Other specified noninflammatory disorders of vagina: Secondary | ICD-10-CM

## 2019-08-07 NOTE — Addendum Note (Signed)
Addended by: Alycia Patten on: 08/07/2019 03:42 PM   Modules accepted: Orders

## 2019-08-07 NOTE — Progress Notes (Signed)
Pt comes to clinic with chief complaint of vaginal discharge and odor. Will screen today for STI and BV. Swab collected successfully and sent to lab. She also is concerned about muscle spasms and requests labs to rule out deficiency. Pt due for PAP. Appointment made. Will follow up with provider at onsite visit March 18th for muscle spasms.

## 2019-08-07 NOTE — Addendum Note (Signed)
Addended by: Alycia Patten on: 08/07/2019 03:43 PM   Modules accepted: Orders

## 2019-08-08 ENCOUNTER — Other Ambulatory Visit: Payer: Self-pay | Admitting: Family

## 2019-08-08 DIAGNOSIS — E559 Vitamin D deficiency, unspecified: Secondary | ICD-10-CM

## 2019-08-08 LAB — THYROID PANEL WITH TSH
Free Thyroxine Index: 2.2 (ref 1.4–3.8)
T3 Uptake: 27 % (ref 22–35)
T4, Total: 8.2 ug/dL (ref 5.1–11.9)
TSH: 2.06 mIU/L

## 2019-08-08 LAB — URINE CYTOLOGY ANCILLARY ONLY
Chlamydia: NEGATIVE
Comment: NEGATIVE
Comment: NORMAL
Neisseria Gonorrhea: NEGATIVE

## 2019-08-08 LAB — WET PREP BY MOLECULAR PROBE
Candida species: NOT DETECTED
Gardnerella vaginalis: NOT DETECTED
MICRO NUMBER:: 10188405
SPECIMEN QUALITY:: ADEQUATE
Trichomonas vaginosis: NOT DETECTED

## 2019-08-08 LAB — B12 AND FOLATE PANEL
Folate: 16.6 ng/mL
Vitamin B-12: 413 pg/mL (ref 200–1100)

## 2019-08-08 LAB — BASIC METABOLIC PANEL
BUN: 9 mg/dL (ref 7–25)
CO2: 22 mmol/L (ref 20–32)
Calcium: 9.5 mg/dL (ref 8.6–10.2)
Chloride: 107 mmol/L (ref 98–110)
Creat: 0.8 mg/dL (ref 0.50–1.10)
Glucose, Bld: 86 mg/dL (ref 65–99)
Potassium: 4.3 mmol/L (ref 3.5–5.3)
Sodium: 141 mmol/L (ref 135–146)

## 2019-08-08 LAB — VITAMIN D 25 HYDROXY (VIT D DEFICIENCY, FRACTURES): Vit D, 25-Hydroxy: 13 ng/mL — ABNORMAL LOW (ref 30–100)

## 2019-08-08 LAB — EXTRA LAV TOP TUBE

## 2019-08-08 LAB — FERRITIN: Ferritin: 25 ng/mL (ref 16–154)

## 2019-08-08 MED ORDER — VITAMIN D (ERGOCALCIFEROL) 1.25 MG (50000 UNIT) PO CAPS: 50000.0000 [IU] | ORAL_CAPSULE | ORAL | 0 refills | Status: DC

## 2019-08-22 ENCOUNTER — Other Ambulatory Visit: Payer: Self-pay | Admitting: Family

## 2019-08-22 DIAGNOSIS — E559 Vitamin D deficiency, unspecified: Secondary | ICD-10-CM

## 2019-08-28 ENCOUNTER — Encounter: Payer: Self-pay | Admitting: Family

## 2019-08-28 ENCOUNTER — Ambulatory Visit (INDEPENDENT_AMBULATORY_CARE_PROVIDER_SITE_OTHER): Payer: Self-pay | Admitting: Family

## 2019-08-28 ENCOUNTER — Other Ambulatory Visit: Payer: Self-pay

## 2019-08-28 VITALS — BP 108/77 | HR 69 | Ht 65.5 in | Wt 198.2 lb

## 2019-08-28 DIAGNOSIS — Z3202 Encounter for pregnancy test, result negative: Secondary | ICD-10-CM

## 2019-08-28 DIAGNOSIS — Z01419 Encounter for gynecological examination (general) (routine) without abnormal findings: Secondary | ICD-10-CM

## 2019-08-28 DIAGNOSIS — E282 Polycystic ovarian syndrome: Secondary | ICD-10-CM

## 2019-08-28 LAB — POCT URINE PREGNANCY: Preg Test, Ur: NEGATIVE

## 2019-08-28 NOTE — Patient Instructions (Signed)
I will call you with results from today's visit.  Be thinking about what option you may want to try for birth control.  You can find other information at [email or web address].

## 2019-08-28 NOTE — Progress Notes (Signed)
History was provided by the patient.  Kristin Haynes is a 19 y.o. female who is here for PAP.   PCP confirmed? No.  Patient, No Pcp Per  HPI:   Here for PAP  Taking OCPs, sometimes stop them when her stomach hurts or she has cramps.  No pregnancy intention  No dyspareunia, dysuria, lesions or discharge changes    Review of Systems  Constitutional: Negative for chills, fever and malaise/fatigue.  HENT: Negative for sore throat.   Eyes: Negative for blurred vision and pain.  Respiratory: Negative for cough and shortness of breath.   Cardiovascular: Negative for chest pain and palpitations.  Gastrointestinal: Negative for abdominal pain and nausea.  Genitourinary: Negative for dysuria and urgency.  Musculoskeletal: Negative for myalgias.  Skin: Negative for rash.  Neurological: Negative for dizziness and headaches.  Psychiatric/Behavioral: The patient is not nervous/anxious.      Patient Active Problem List   Diagnosis Date Noted  . Dysmenorrhea 10/05/2017  . Abdominal pain 03/31/2016  . Hyperlipidemia 01/06/2016  . Vitamin D deficiency 07/20/2015  . PCOS (polycystic ovarian syndrome) 07/18/2015  . Tension headache 05/03/2015  . Migraine without aura and without status migrainosus, not intractable 05/03/2015  . 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 Rhinitis, allergic 04/30/2014  . Mild intermittent asthma 04/30/2014  . Acanthosis nigricans 04/30/2014    Current Outpatient Medications on File Prior to Visit  Medication Sig Dispense Refill  . LO LOESTRIN FE 1 MG-10 MCG / 10 MCG tablet TAKE 1 TABLET BY MOUTH EVERY DAY 28 tablet 11  . NAPROXEN DR 500 MG EC tablet TAKE 1 TABLET BY MOUTH TWICE A DAY WITH MEALS 60 tablet 0  . Vitamin D, Ergocalciferol, (DRISDOL) 1.25 MG (50000 UNIT) CAPS capsule Take 1 capsule (50,000 Units total) by mouth every 7 (seven) days. 8 capsule 0  . albuterol (PROVENTIL HFA;VENTOLIN HFA) 108 (90 Base) MCG/ACT inhaler Inhale 2 puffs into  the lungs every 6 (six) hours as needed for wheezing or shortness of breath. (Patient not taking: Reported on 08/28/2019) 1 Inhaler 1  . cyclobenzaprine (FLEXERIL) 5 MG tablet Take 1 tablet (5 mg total) by mouth as needed for muscle spasms. (Patient not taking: Reported on 08/28/2019) 30 tablet 0  . ibuprofen (ADVIL,MOTRIN) 600 MG tablet TAKE 1 TABLET (600 MG TOTAL) BY MOUTH EVERY 6 (SIX) HOURS AS NEEDED FOR HEADACHE.  2   No current facility-administered medications on file prior to visit.    Allergies  Allergen Reactions  . Other     Seasonal Allergies       Physical Exam:    Vitals:   08/28/19 1024  BP: 108/77  Pulse: 69  Weight: 198 lb 3.2 oz (89.9 kg)  Height: 5' 5.5" (1.664 m)    Growth percentile SmartLinks can only be used for patients less than 41 years old. No LMP recorded.  Physical Exam Vitals reviewed. Exam conducted with a chaperone present.  Constitutional:      Appearance: Normal appearance. She is not ill-appearing.  HENT:     Mouth/Throat:     Mouth: Mucous membranes are moist.  Eyes:     Pupils: Pupils are equal, round, and reactive to light.  Cardiovascular:     Rate and Rhythm: Normal rate and regular rhythm.     Heart sounds: No murmur.  Pulmonary:     Effort: Pulmonary effort is normal.  Abdominal:     General: There is no distension.     Palpations: There is no mass.  Tenderness: There is no guarding.  Genitourinary:    General: Normal vulva.     Exam position: Lithotomy position.     Vagina: Normal. No vaginal discharge, tenderness or bleeding.     Cervix: No cervical motion tenderness or friability.     Uterus: Normal.      Adnexa: Right adnexa normal and left adnexa normal.     Rectum: Normal.  Musculoskeletal:        General: No deformity. Normal range of motion.     Cervical back: Normal range of motion.  Lymphadenopathy:     Cervical: No cervical adenopathy.  Skin:    General: Skin is warm and dry.     Findings: No rash.   Neurological:     General: No focal deficit present.     Mental Status: She is alert and oriented to person, place, and time.  Psychiatric:        Mood and Affect: Mood normal.      Assessment/Plan:  89 A/I female presents for PAP. She has no signs of infection on exam.  We discussed continuation of OCPs as her preferred method of contraception. Discussed EC as needed; she needs a low-dose of estrogen as she did not tolerate higher estrogen forms (nausea). She is due for PCOS monitoring labs and will order those today.    1. PCOS (polycystic ovarian syndrome)  - CBC with Differential/Platelet - Comprehensive metabolic panel - Lipid panel - VITAMIN D 25 Hydroxy (Vit-D Deficiency, Fractures) - POCT urine pregnancy - Hemoglobin A1c  2. Pap test, as part of routine gynecological examination  - Pap IG and Chlamydia/Gonococcus, NAA  3. Negative pregnancy test

## 2019-08-29 ENCOUNTER — Encounter: Payer: Self-pay | Admitting: Family

## 2019-08-29 ENCOUNTER — Other Ambulatory Visit: Payer: Self-pay | Admitting: Family

## 2019-08-29 MED ORDER — METFORMIN HCL ER 750 MG PO TB24: 750.0000 mg | ORAL_TABLET | Freq: Every day | ORAL | 1 refills | Status: DC

## 2019-08-29 MED ORDER — LO LOESTRIN FE 1 MG-10 MCG / 10 MCG PO TABS: 1.0000 | ORAL_TABLET | Freq: Every day | ORAL | 11 refills | Status: DC

## 2019-09-01 ENCOUNTER — Encounter: Payer: Self-pay | Admitting: Family

## 2019-09-01 LAB — PAP IG AND CT-NG NAA
C. trachomatis RNA, TMA: DETECTED — AB
N. gonorrhoeae RNA, TMA: NOT DETECTED

## 2019-09-02 ENCOUNTER — Other Ambulatory Visit: Payer: Self-pay | Admitting: Family

## 2019-09-02 MED ORDER — AZITHROMYCIN 500 MG PO TABS: 1000.0000 mg | ORAL_TABLET | Freq: Once | ORAL | 0 refills | Status: AC

## 2019-09-17 ENCOUNTER — Other Ambulatory Visit: Payer: Self-pay | Admitting: Family

## 2019-09-17 DIAGNOSIS — E559 Vitamin D deficiency, unspecified: Secondary | ICD-10-CM

## 2019-10-02 ENCOUNTER — Other Ambulatory Visit: Payer: Self-pay | Admitting: Family

## 2019-10-02 DIAGNOSIS — E559 Vitamin D deficiency, unspecified: Secondary | ICD-10-CM

## 2019-10-29 ENCOUNTER — Encounter: Payer: Self-pay | Admitting: Family

## 2019-10-29 ENCOUNTER — Other Ambulatory Visit: Payer: Self-pay | Admitting: Pediatrics

## 2019-10-29 DIAGNOSIS — J4520 Mild intermittent asthma, uncomplicated: Secondary | ICD-10-CM

## 2019-10-29 MED ORDER — ALBUTEROL SULFATE HFA 108 (90 BASE) MCG/ACT IN AERS: 2.0000 | INHALATION_SPRAY | Freq: Four times a day (QID) | RESPIRATORY_TRACT | 0 refills | Status: DC | PRN

## 2019-11-04 ENCOUNTER — Encounter: Payer: Self-pay | Admitting: Pediatrics

## 2019-11-04 ENCOUNTER — Other Ambulatory Visit: Payer: Self-pay

## 2019-11-04 ENCOUNTER — Ambulatory Visit (INDEPENDENT_AMBULATORY_CARE_PROVIDER_SITE_OTHER): Payer: Self-pay | Admitting: Pediatrics

## 2019-11-04 ENCOUNTER — Other Ambulatory Visit (HOSPITAL_COMMUNITY)
Admission: RE | Admit: 2019-11-04 | Discharge: 2019-11-04 | Disposition: A | Discharge status: Home / Self Care | Payer: Self-pay | Source: Ambulatory Visit | Attending: Pediatrics | Admitting: Pediatrics

## 2019-11-04 VITALS — Ht 64.57 in | Wt 197.8 lb

## 2019-11-04 DIAGNOSIS — L83 Acanthosis nigricans: Secondary | ICD-10-CM

## 2019-11-04 DIAGNOSIS — Z113 Encounter for screening for infections with a predominantly sexual mode of transmission: Secondary | ICD-10-CM | POA: Insufficient documentation

## 2019-11-04 DIAGNOSIS — Z7251 High risk heterosexual behavior: Secondary | ICD-10-CM

## 2019-11-04 DIAGNOSIS — Z3049 Encounter for surveillance of other contraceptives: Secondary | ICD-10-CM

## 2019-11-04 DIAGNOSIS — Z30017 Encounter for initial prescription of implantable subdermal contraceptive: Secondary | ICD-10-CM

## 2019-11-04 DIAGNOSIS — Z3202 Encounter for pregnancy test, result negative: Secondary | ICD-10-CM

## 2019-11-04 DIAGNOSIS — E282 Polycystic ovarian syndrome: Secondary | ICD-10-CM

## 2019-11-04 LAB — POCT URINE PREGNANCY: Preg Test, Ur: NEGATIVE

## 2019-11-04 MED ORDER — ETONOGESTREL 68 MG ~~LOC~~ IMPL
68.0000 mg | DRUG_IMPLANT | Freq: Once | SUBCUTANEOUS | Status: AC
  Administered 2019-11-04: 68 mg via SUBCUTANEOUS

## 2019-11-04 MED ORDER — METFORMIN HCL ER 750 MG PO TB24: 1500.0000 mg | ORAL_TABLET | Freq: Every day | ORAL | 1 refills | Status: DC

## 2019-11-04 MED ORDER — LEVONORGESTREL 1.5 MG PO TABS
1.5000 mg | ORAL_TABLET | Freq: Once | ORAL | Status: AC
Start: 2019-11-04 — End: 2019-11-04
  Administered 2019-11-04: 1.5 mg via ORAL

## 2019-11-04 NOTE — Patient Instructions (Signed)
Increase metformin to 2 tablets in the morning. If you have stomach upset, go back to one.  Start multivitamin with metformin Continue vit d gummy   Congratulations on getting your Nexplanon placement!  Below is some important information about Nexplanon.  First remember that Nexplanon does not prevent sexually transmitted infections.  Condoms will help prevent sexually transmitted infections. The Nexplanon starts working 7 days after it was inserted.  There is a risk of getting pregnant if you have unprotected sex in those first 7 days after placement of the Nexplanon.  The Nexplanon lasts for 3 years but can be removed at any time.  You can become pregnant as early as 1 week after removal.  You can have a new Nexplanon put in after the old one is removed if you like.  It is not known whether Nexplanon is as effective in women who are very overweight because the studies did not include many overweight women.  Nexplanon interacts with some medications, including barbiturates, bosentan, carbamazepine, felbamate, griseofulvin, oxcarbazepine, phenytoin, rifampin, St. John's wort, topiramate, HIV medicines.  Please alert your doctor if you are on any of these medicines.  Always tell other healthcare providers that you have a Nexplanon in your arm.  The Nexplanon was placed just under the skin.  Leave the outside bandage on for 24 hours.  Leave the smaller bandage on for 3-5 days or until it falls off on its own.  Keep the area clean and dry for 3-5 days. There is usually bruising or swelling at the insertion site for a few days to a week after placement.  If you see redness or pus draining from the insertion site, call us immediately.  Keep your user card with the date the implant was placed and the date the implant is to be removed.  The most common side effect is a change in your menstrual bleeding pattern.   This bleeding is generally not harmful to you but can be annoying.  Call or come in to  see [place] if you have any concerns about the bleeding or if you have any side effects or questions.    We will call you in 1 week to check in and we would like you to return to the clinic for a follow-up visit in 1 month.  You can call [facility] 24 hours a day with any questions or concerns.  There is always a nurse or doctor available to take your call.  Call 9-1-1 if you have a life-threatening emergency.  For anything else, please call us at [phone] before heading to the ER.

## 2019-11-04 NOTE — Progress Notes (Signed)
History was provided by the patient.  Kristin Haynes is a 19 y.o. female who is here for STI testing, contraception.  Patient, No Pcp Per   HPI:  Pt reports that she wants to get retested for chlamydia. She went back to her old partner who gave her chlamydia.   Sexually active 2 days ago without protection, time before was night before.  LMP was 5/13-19.   Taking metformin. No issues with your stomach. She is taking 1 a day.   Had a sore throat, cough. Needed albuterol but improving. Denies fever, n/v/d. No sick contacts.   Working at [employer].   Patient's last menstrual period was 10/23/2019.  Review of Systems  Constitutional: Negative for malaise/fatigue.  Eyes: Negative for double vision.  Respiratory: Negative for shortness of breath.   Cardiovascular: Negative for chest pain and palpitations.  Gastrointestinal: Negative for abdominal pain, constipation, diarrhea, nausea and vomiting.  Genitourinary: Positive for frequency. Negative for dysuria.  Musculoskeletal: Negative for joint pain and myalgias.  Skin: Negative for rash.  Neurological: Negative for dizziness and headaches.  Endo/Heme/Allergies: Does not bruise/bleed easily.  Psychiatric/Behavioral: Negative for depression. The patient is nervous/anxious and has insomnia.     Patient Active Problem List   Diagnosis Date Noted  . Dysmenorrhea 10/05/2017  . Abdominal pain 03/31/2016  . Hyperlipidemia 01/06/2016  . Vitamin D deficiency 07/20/2015  . PCOS (polycystic ovarian syndrome) 07/18/2015  . Tension headache 05/03/2015  . Migraine without aura and without status migrainosus, not intractable 05/03/2015  . 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 Rhinitis, allergic 04/30/2014  . Mild intermittent asthma 04/30/2014  . Acanthosis nigricans 04/30/2014    Current Outpatient Medications on File Prior to Visit  Medication Sig Dispense Refill  . albuterol (VENTOLIN HFA) 108 (90 Base) MCG/ACT inhaler  Inhale 2 puffs into the lungs every 6 (six) hours as needed for wheezing or shortness of breath. 18 g 0  . NAPROXEN DR 500 MG EC tablet TAKE 1 TABLET BY MOUTH TWICE A DAY WITH MEALS 60 tablet 0  . Vitamin D, Ergocalciferol, (DRISDOL) 1.25 MG (50000 UNIT) CAPS capsule TAKE 1 CAPSULE (50,000 UNITS TOTAL) BY MOUTH EVERY 7 (SEVEN) DAYS. 12 capsule 1  . cyclobenzaprine (FLEXERIL) 5 MG tablet Take 1 tablet (5 mg total) by mouth as needed for muscle spasms. (Patient not taking: Reported on 08/28/2019) 30 tablet 0  . etonogestrel (NEXPLANON) 68 MG IMPL implant 1 each (68 mg total) by Subdermal route once. 1 each 0  . ibuprofen (ADVIL,MOTRIN) 600 MG tablet TAKE 1 TABLET (600 MG TOTAL) BY MOUTH EVERY 6 (SIX) HOURS AS NEEDED FOR HEADACHE.  2  . Norethindrone-Ethinyl Estradiol-Fe Biphas (LO LOESTRIN FE) 1 MG-10 MCG / 10 MCG tablet Take 1 tablet by mouth daily. (Patient not taking: Reported on 11/04/2019) 28 tablet 11   No current facility-administered medications on file prior to visit.    Allergies  Allergen Reactions  . Other     Seasonal Allergies        Physical Exam:    Vitals:   11/04/19 1025  Weight: 197 lb 12.8 oz (89.7 kg)  Height: 5' 4.57" (1.64 m)    Growth percentile SmartLinks can only be used for patients less than 31 years old.  Physical Exam Vitals and nursing note reviewed. Exam conducted with a chaperone present.  Constitutional:      General: She is not in acute distress.    Appearance: She is well-developed.  Neck:     Thyroid:  No thyromegaly.  Cardiovascular:     Rate and Rhythm: Normal rate and regular rhythm.     Heart sounds: No murmur.  Pulmonary:     Breath sounds: Normal breath sounds.  Abdominal:     Palpations: Abdomen is soft. There is no mass.     Tenderness: There is no abdominal tenderness. There is no guarding.  Genitourinary:    General: Normal vulva.     Labia:        Right: No rash.        Left: No rash.      Vagina: Vaginal discharge present.      Cervix: Normal.     Uterus: Normal.      Adnexa: Right adnexa normal and left adnexa normal.  Musculoskeletal:     Right lower leg: No edema.     Left lower leg: No edema.  Lymphadenopathy:     Cervical: No cervical adenopathy.  Skin:    General: Skin is warm.     Findings: No rash.  Neurological:     Mental Status: She is alert.     Comments: No tremor     Assessment/Plan: 1. Insertion of Nexplanon See procedure note. Tolerated well with no concerns. Discussed no effective for 7 days.  - etonogestrel (NEXPLANON) 68 MG IMPL implant; 1 each (68 mg total) by Subdermal route once.  Dispense: 1 each; Refill: 0 - etonogestrel (NEXPLANON) implant 68 mg - Subdermal Etonogestrel Implant Insertion  2. Unprotected sexual intercourse Plan B today. Discussed risks of pregnancy. Will see her in 5 weeks to repeat urine preg.  - levonorgestrel (PLAN B 1-STEP) tablet 1.5 mg  3. PCOS (polycystic ovarian syndrome) Tolerating metformin well. Will increase to 1500 mg for continued improvement in insulin resistance and acanthosis.  - metFORMIN (GLUCOPHAGE XR) 750 MG 24 hr tablet; Take 2 tablets (1,500 mg total) by mouth daily with breakfast.  Dispense: 60 tablet; Refill: 1  4. Acanthosis nigricans As above.   5. Routine screening for STI (sexually transmitted infection) Significant yellow, thick vaginal discharge with blood tinge. I suspect she likely has some sort of infection which we discussed today, but no CMT, so we will not treat until it is clear what needs tx.  - Urine cytology ancillary only - WET PREP BY MOLECULAR PROBE - C. trachomatis/N. gonorrhoeae RNA - HIV antibody (with reflex) - RPR  6. Pregnancy examination or test, negative result Neg today.  - POCT urine pregnancy  5 weeks in clinic   Alfonso Ramus, [place]

## 2019-11-05 ENCOUNTER — Other Ambulatory Visit: Payer: Self-pay | Admitting: Pediatrics

## 2019-11-05 LAB — WET PREP BY MOLECULAR PROBE
Candida species: DETECTED — AB
Gardnerella vaginalis: NOT DETECTED
MICRO NUMBER:: 10516922
SPECIMEN QUALITY:: ADEQUATE
Trichomonas vaginosis: NOT DETECTED

## 2019-11-05 LAB — HIV ANTIBODY (ROUTINE TESTING W REFLEX): HIV 1&2 Ab, 4th Generation: NONREACTIVE

## 2019-11-05 LAB — C. TRACHOMATIS/N. GONORRHOEAE RNA
C. trachomatis RNA, TMA: NOT DETECTED
N. gonorrhoeae RNA, TMA: NOT DETECTED

## 2019-11-05 LAB — RPR: RPR Ser Ql: NONREACTIVE

## 2019-11-05 MED ORDER — FLUCONAZOLE 150 MG PO TABS
ORAL_TABLET | ORAL | 0 refills | Status: DC
Start: 2019-11-05 — End: 2020-03-25

## 2019-11-07 LAB — URINE CYTOLOGY ANCILLARY ONLY
Bacterial Vaginitis-Urine: NEGATIVE
Candida Urine: NEGATIVE
Chlamydia: NEGATIVE
Comment: NEGATIVE
Comment: NEGATIVE
Comment: NORMAL
Neisseria Gonorrhea: NEGATIVE
Trichomonas: NEGATIVE

## 2019-11-24 ENCOUNTER — Other Ambulatory Visit: Payer: Self-pay | Admitting: Pediatrics

## 2019-11-24 DIAGNOSIS — J4520 Mild intermittent asthma, uncomplicated: Secondary | ICD-10-CM

## 2019-12-09 ENCOUNTER — Other Ambulatory Visit: Payer: Self-pay

## 2019-12-09 ENCOUNTER — Other Ambulatory Visit (HOSPITAL_COMMUNITY)
Admission: RE | Admit: 2019-12-09 | Discharge: 2019-12-09 | Disposition: A | Discharge status: Home / Self Care | Payer: BC Managed Care – PPO | Source: Ambulatory Visit | Attending: Family | Admitting: Family

## 2019-12-09 ENCOUNTER — Encounter: Payer: Self-pay | Admitting: Family

## 2019-12-09 ENCOUNTER — Ambulatory Visit: Payer: Self-pay | Admitting: Pediatrics

## 2019-12-09 ENCOUNTER — Ambulatory Visit (INDEPENDENT_AMBULATORY_CARE_PROVIDER_SITE_OTHER): Payer: Self-pay | Admitting: Family

## 2019-12-09 VITALS — BP 126/80 | HR 64 | Ht 64.57 in | Wt 192.6 lb

## 2019-12-09 DIAGNOSIS — N898 Other specified noninflammatory disorders of vagina: Secondary | ICD-10-CM | POA: Diagnosis present

## 2019-12-09 DIAGNOSIS — Z113 Encounter for screening for infections with a predominantly sexual mode of transmission: Secondary | ICD-10-CM | POA: Insufficient documentation

## 2019-12-09 DIAGNOSIS — L83 Acanthosis nigricans: Secondary | ICD-10-CM

## 2019-12-09 DIAGNOSIS — E282 Polycystic ovarian syndrome: Secondary | ICD-10-CM

## 2019-12-09 DIAGNOSIS — Z975 Presence of (intrauterine) contraceptive device: Secondary | ICD-10-CM

## 2019-12-09 NOTE — Progress Notes (Signed)
History was provided by the patient.  Kristin Haynes is a 19 y.o. female who is here for follow up on PCOS, nexplanon in place, and vaginal discharge.   PCP confirmed? no  Patient, No Pcp Per  HPI:   -took med for yeast infection, feels she may still have something going on  -persistent yellow discharge, smells but not described as fishy -talking to a new partner; requesting STI screening -denies vaginal lesions, pain with intercourse, or unexplained bleeding  -no bleeding since nexplanon insertion on 5/25 -started taking 2 Metformin pills about 2 months ago and has noticed nausea  -drinks a lot of caffeine - redbull or coffee relieves headaches when they come on  -she stopped taking ibuprofen, occasional Aleve when needed for headaches  -got a promotion at work [employer]) 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  Eyes: Negative for blurred vision and pain.  Respiratory: Negative for shortness of breath.   Cardiovascular: Negative for chest pain and palpitations.  Gastrointestinal: Positive for nausea.  Genitourinary: Negative for dysuria and frequency.  Musculoskeletal: Negative for joint pain and myalgias.  Neurological: Positive for headaches. Negative for dizziness.  Psychiatric/Behavioral: Negative for depression. The patient is not nervous/anxious.      Patient Active Problem List   Diagnosis Date Noted  . Dysmenorrhea 10/05/2017  . Abdominal pain 03/31/2016  . Hyperlipidemia 01/06/2016  . Vitamin D deficiency 07/20/2015  . PCOS (polycystic ovarian syndrome) 07/18/2015  . Tension headache 05/03/2015  . Migraine without aura and without status migrainosus, not intractable 05/03/2015  . 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 Rhinitis, allergic 04/30/2014  . Mild  intermittent asthma 04/30/2014  . Acanthosis nigricans 04/30/2014    Current Outpatient Medications on File Prior to Visit  Medication Sig Dispense Refill  . albuterol (VENTOLIN HFA) 108 (90 Base) MCG/ACT inhaler INHALE 2 PUFFS INTO THE LUNGS EVERY 6 HOURS AS NEEDED FOR WHEEZE OR SHORTNESS OF BREATH 8.5 g 2  . etonogestrel (NEXPLANON) 68 MG IMPL implant 1 each (68 mg total) by Subdermal route once. 1 each 0  . metFORMIN (GLUCOPHAGE XR) 750 MG 24 hr tablet Take 2 tablets (1,500 mg total) by mouth daily with breakfast. 60 tablet 1  . Vitamin D, Ergocalciferol, (DRISDOL) 1.25 MG (50000 UNIT) CAPS capsule TAKE 1 CAPSULE (50,000 UNITS TOTAL) BY MOUTH EVERY 7 (SEVEN) DAYS. 12 capsule 1  . cyclobenzaprine (FLEXERIL) 5 MG tablet Take 1 tablet (5 mg total) by mouth as needed for muscle spasms. (Patient not taking: Reported on 08/28/2019) 30 tablet 0  . fluconazole (DIFLUCAN) 150 MG tablet Take 1 tablet today and 1 tablet 3 days from now (Patient not taking: Reported on 12/09/2019) 2 tablet 0  . ibuprofen (ADVIL,MOTRIN) 600 MG tablet TAKE 1 TABLET (600 MG TOTAL) BY MOUTH EVERY 6 (SIX) HOURS AS NEEDED FOR HEADACHE.  2  . NAPROXEN DR 500 MG EC tablet TAKE 1 TABLET BY MOUTH TWICE A DAY WITH MEALS 60 tablet 0  . Norethindrone-Ethinyl Estradiol-Fe Biphas (LO LOESTRIN FE) 1 MG-10 MCG / 10 MCG tablet Take 1 tablet by mouth daily. (Patient not taking: Reported on 11/04/2019) 28 tablet 11  . [DISCONTINUED] albuterol (VENTOLIN HFA) 108 (90 Base) MCG/ACT inhaler Inhale 2 puffs into the lungs every 6 (six) hours as needed for wheezing or shortness of breath.  18 g 0   No current facility-administered medications on file prior to visit.    Allergies  Allergen Reactions  . Other     Seasonal Allergies       Physical Exam:    Vitals:   12/09/19 1055  BP: 126/80  Pulse: 64  Weight: 192 lb 9.6 oz (87.4 kg)  Height: 5' 4.57" (1.64 m)    Growth percentile SmartLinks can only be used for patients less than 62  years old. No LMP recorded.  Physical Exam Vitals reviewed. Exam conducted with a chaperone present.  Constitutional:      Appearance: Normal appearance.  HENT:     Head: Normocephalic.     Mouth/Throat:     Mouth: Mucous membranes are moist.  Eyes:     General: No scleral icterus.    Extraocular Movements: Extraocular movements intact.     Pupils: Pupils are equal, round, and reactive to light.  Cardiovascular:     Rate and Rhythm: Normal rate and regular rhythm.     Heart sounds: No murmur heard.   Pulmonary:     Effort: Pulmonary effort is normal.  Abdominal:     General: There is no distension.  Genitourinary:    Comments: Self swab; exam deferred  Musculoskeletal:        General: Normal range of motion.     Cervical back: Normal range of motion.  Lymphadenopathy:     Cervical: No cervical adenopathy.  Skin:    General: Skin is warm and dry.     Findings: No rash.     Comments: Implant palpable correct position ULE   Neurological:     General: No focal deficit present.     Mental Status: She is alert and oriented to person, place, and time.  Psychiatric:        Mood and Affect: Mood normal.      Assessment/Plan: 1. PCOS (polycystic ovarian syndrome) -labs today  -take Metformin with a meal  -menstrual suppression with implant  - Hemoglobin A1c - COMPLETE METABOLIC PANEL WITH GFR - CBC - Lipid panel  2. Acanthosis nigricans -recheck A1C and monitoring labs for Metformin today  -discussed nausea and GI upset common with Metformin use on empty stomach  - Hemoglobin A1c - COMPLETE METABOLIC PANEL WITH GFR  3. Vaginal discharge -will retest for persistent yeast vs +/- BV  -discussed normal physiological changes in cervical mucosa during cycle vs concerns for infectious etiology; discussed vaginal hygiene practices to reduce risks for pH imbalances - WET PREP BY MOLECULAR PROBE - Urine cytology ancillary only  4. Nexplanon in place -amenorrhea with  method, no concerns  -palpable in correct position ULE  5. Routine screening for STI (sexually transmitted infection) -per patient request -discussed that HSV serology is not standard care; she has no symptoms for HSV concerns and denies partner having symptoms; advised on symptoms and to return to clinic if present; condom use reviewed  - Urine cytology ancillary only - RPR - HIV Antibody (routine testing w rflx)

## 2019-12-09 NOTE — Patient Instructions (Signed)
It was good to see you today.  I will call you with results or send you a message in My Chart from today's lab work.  Remember to have something on your stomach when you take your metformin. That will likely reduce the nauseous feeling. / Let me know if you have any questions!    Take care and congratulations on your promotion at work!   Have a safe and happy 4th of July!   [employer]

## 2019-12-10 ENCOUNTER — Encounter: Payer: Self-pay | Admitting: Family

## 2019-12-10 LAB — COMPLETE METABOLIC PANEL WITH GFR
AG Ratio: 1.9 (calc) (ref 1.0–2.5)
ALT: 12 U/L (ref 6–29)
AST: 16 U/L (ref 10–30)
Albumin: 4.7 g/dL (ref 3.6–5.1)
Alkaline phosphatase (APISO): 49 U/L (ref 31–125)
BUN/Creatinine Ratio: 10 (calc) (ref 6–22)
BUN: 11 mg/dL (ref 7–25)
CO2: 24 mmol/L (ref 20–32)
Calcium: 9.8 mg/dL (ref 8.6–10.2)
Chloride: 106 mmol/L (ref 98–110)
Creat: 1.12 mg/dL — ABNORMAL HIGH (ref 0.50–1.10)
GFR, Est African American: 81 mL/min/{1.73_m2} (ref >=60)
GFR, Est Non African American: 70 mL/min/{1.73_m2} (ref >=60)
Globulin: 2.5 g/dL (calc) (ref 1.9–3.7)
Glucose, Bld: 145 mg/dL — ABNORMAL HIGH (ref 65–99)
Potassium: 4.3 mmol/L (ref 3.5–5.3)
Sodium: 141 mmol/L (ref 135–146)
Total Bilirubin: 0.7 mg/dL (ref 0.2–1.2)
Total Protein: 7.2 g/dL (ref 6.1–8.1)

## 2019-12-10 LAB — WET PREP BY MOLECULAR PROBE
Candida species: NOT DETECTED
Gardnerella vaginalis: NOT DETECTED
MICRO NUMBER:: 10647592
SPECIMEN QUALITY:: ADEQUATE
Trichomonas vaginosis: NOT DETECTED

## 2019-12-10 LAB — URINE CYTOLOGY ANCILLARY ONLY
Chlamydia: NEGATIVE
Comment: NEGATIVE
Comment: NORMAL
Neisseria Gonorrhea: NEGATIVE

## 2019-12-10 LAB — CBC
HCT: 40.5 % (ref 35.0–45.0)
Hemoglobin: 13.6 g/dL (ref 11.7–15.5)
MCH: 32.2 pg (ref 27.0–33.0)
MCHC: 33.6 g/dL (ref 32.0–36.0)
MCV: 96 fL (ref 80.0–100.0)
MPV: 10.6 fL (ref 7.5–12.5)
Platelets: 346 10*3/uL (ref 140–400)
RBC: 4.22 10*6/uL (ref 3.80–5.10)
RDW: 11.6 % (ref 11.0–15.0)
WBC: 6.9 10*3/uL (ref 3.8–10.8)

## 2019-12-10 LAB — HEMOGLOBIN A1C
Hgb A1c MFr Bld: 4.9 % of total Hgb (ref <5.7)
Mean Plasma Glucose: 94 (calc)
eAG (mmol/L): 5.2 (calc)

## 2019-12-10 LAB — RPR: RPR Ser Ql: REACTIVE — AB

## 2019-12-10 LAB — RPR TITER: RPR Titer: 1:1 {titer} — ABNORMAL HIGH

## 2019-12-10 LAB — FLUORESCENT TREPONEMAL AB(FTA)-IGG-BLD: Fluorescent Treponemal ABS: NONREACTIVE

## 2019-12-13 ENCOUNTER — Emergency Department (HOSPITAL_COMMUNITY)
Admission: EM | Admit: 2019-12-13 | Discharge: 2019-12-13 | Disposition: A | Discharge status: Home / Self Care | Payer: BC Managed Care – PPO | Attending: Emergency Medicine | Admitting: Emergency Medicine

## 2019-12-13 ENCOUNTER — Encounter (HOSPITAL_COMMUNITY): Payer: Self-pay | Admitting: Emergency Medicine

## 2019-12-13 ENCOUNTER — Other Ambulatory Visit: Payer: Self-pay

## 2019-12-13 DIAGNOSIS — R7309 Other abnormal glucose: Secondary | ICD-10-CM | POA: Diagnosis not present

## 2019-12-13 DIAGNOSIS — J45909 Unspecified asthma, uncomplicated: Secondary | ICD-10-CM | POA: Diagnosis not present

## 2019-12-13 DIAGNOSIS — Z7951 Long term (current) use of inhaled steroids: Secondary | ICD-10-CM | POA: Insufficient documentation

## 2019-12-13 DIAGNOSIS — Z139 Encounter for screening, unspecified: Secondary | ICD-10-CM

## 2019-12-13 DIAGNOSIS — Z7984 Long term (current) use of oral hypoglycemic drugs: Secondary | ICD-10-CM | POA: Diagnosis not present

## 2019-12-13 LAB — CBG MONITORING, ED: Glucose-Capillary: 104 mg/dL — ABNORMAL HIGH (ref 70–99)

## 2019-12-13 NOTE — ED Provider Notes (Signed)
[facility] EMERGENCY DEPARTMENT Provider Note   CSN: [id number] Arrival date & time: 12/13/19  1124     History Chief Complaint  Patient presents with   Blood Sugar Problem    Kristin Haynes is a 19 y.o. female.  HPI      Kristin Haynes is a 19 y.o. female who presents to the Emergency Department requesting evaluation for possible diabetes.  She was taking Glucophage 750 mg once daily for her PCOS and increased to 1500 mg daily.  She stopped taking the medication one week ago because it was upsetting her stomach.  She is concerned if she has diabetes.  She also is requesting evaluation of a recent abnormal RPR test.  She was seen by her  PCP nearly 1 week ago and had a positive RPR and elevated RPR titer.  She had a fluorescent treponemal Ab that was negative.  She is here today with concerns that whether or not she may have syphilis.  She denies any symptoms at this time.      Past Medical History:  Diagnosis Date   Asthma    cough-variant asthma    Patient Active Problem List   Diagnosis Date Noted   Dysmenorrhea 10/05/2017   Abdominal pain 03/31/2016   Hyperlipidemia 01/06/2016   Vitamin D deficiency 07/20/2015   PCOS (polycystic ovarian syndrome) 07/18/2015   Tension headache 05/03/2015   Migraine without aura and without status migrainosus, not intractable 05/03/2015   Obesity peds (BMI >=95 percentile) 04/30/2014   Rhinitis, allergic 04/30/2014   Mild intermittent asthma 04/30/2014   Acanthosis nigricans 04/30/2014    History reviewed. No pertinent surgical history.   OB History   No obstetric history on file.     Family History  Problem Relation Age of Onset   Hyperlipidemia Mother    Migraines Mother    Asthma Maternal Grandmother    Diabetes Maternal Grandmother    Heart disease Maternal Grandmother        congestive heart failure   Cancer Maternal Grandmother        breast cancer (negative testing for genetic causes)     Heart disease Maternal Grandfather 49       MI   Migraines Maternal Grandfather    Asthma Sister    Seizures Other        2 paternal 1st cousins have seizures, maternal 1st cousin had febrile seizures (resolved), MGU has seizures    Social History   Tobacco Use   Smoking status: Never Smoker   Smokeless tobacco: Never Used  Substance Use Topics   Alcohol use: No    Alcohol/week: 0.0 standard drinks   Drug use: No    Home Medications Prior to Admission medications   Medication Sig Start Date End Date Taking? Authorizing Provider  albuterol (VENTOLIN HFA) 108 (90 Base) MCG/ACT inhaler INHALE 2 PUFFS INTO THE LUNGS EVERY 6 HOURS AS NEEDED FOR WHEEZE OR SHORTNESS OF BREATH 11/24/19  Yes Alfonso Ramus T, FNP  etonogestrel (NEXPLANON) 68 MG IMPL implant 1 each (68 mg total) by Subdermal route once.   Yes Verneda Skill, FNP  metFORMIN (GLUCOPHAGE XR) 750 MG 24 hr tablet Take 2 tablets (1,500 mg total) by mouth daily with breakfast. 11/04/19  Yes Verneda Skill, FNP  cyclobenzaprine (FLEXERIL) 5 MG tablet Take 1 tablet (5 mg total) by mouth as needed for muscle spasms. Patient not taking: Reported on 08/28/2019 05/06/19   Alfonso Ramus T, FNP  fluconazole (DIFLUCAN) 150 MG tablet Take  1 tablet today and 1 tablet 3 days from now Patient not taking: Reported on 12/09/2019 11/05/19   Alfonso Ramus T, FNP  ibuprofen (ADVIL,MOTRIN) 600 MG tablet TAKE 1 TABLET (600 MG TOTAL) BY MOUTH EVERY 6 (SIX) HOURS AS NEEDED FOR HEADACHE. Patient not taking: Reported on 12/13/2019 03/18/16   [provider]  NAPROXEN DR 500 MG EC tablet TAKE 1 TABLET BY MOUTH TWICE A DAY WITH MEALS Patient not taking: Reported on 12/13/2019 04/24/18   Verneda Skill, FNP  Norethindrone-Ethinyl Estradiol-Fe Biphas (LO LOESTRIN FE) 1 MG-10 MCG / 10 MCG tablet Take 1 tablet by mouth daily. Patient not taking: Reported on 11/04/2019 08/29/19   Georges Mouse, NP  Vitamin D, Ergocalciferol,  (DRISDOL) 1.25 MG (50000 UNIT) CAPS capsule TAKE 1 CAPSULE (50,000 UNITS TOTAL) BY MOUTH EVERY 7 (SEVEN) DAYS. Patient not taking: Reported on 12/13/2019 10/02/19   Verneda Skill, FNP  albuterol (VENTOLIN HFA) 108 (90 Base) MCG/ACT inhaler Inhale 2 puffs into the lungs every 6 (six) hours as needed for wheezing or shortness of breath. 10/29/19   Verneda Skill, FNP    Allergies    Other  Review of Systems   Review of Systems  Constitutional: Negative for chills and fever.  Respiratory: Negative for cough and shortness of breath.   Cardiovascular: Negative for chest pain.  Gastrointestinal: Negative for abdominal pain, diarrhea, nausea and vomiting.  Endocrine: Negative for polydipsia and polyuria.  Genitourinary: Negative for dysuria, pelvic pain, vaginal bleeding and vaginal discharge.  Musculoskeletal: Negative for back pain and myalgias.  Skin: Negative for rash.  Neurological: Negative for dizziness, weakness and headaches.  Hematological: Does not bruise/bleed easily.  Psychiatric/Behavioral: The patient is not nervous/anxious.     Physical Exam Updated Vital Signs BP 117/86    Pulse 62    Temp 98.7 F (37.1 C) (Oral)    Resp 16    Ht 5\' 4"  (1.626 m)    Wt 87.1 kg    SpO2 98%    BMI 32.96 kg/m   Physical Exam Vitals and nursing note reviewed.  Constitutional:      Appearance: Normal appearance. She is not ill-appearing or toxic-appearing.  HENT:     Head: Atraumatic.     Mouth/Throat:     Mouth: Mucous membranes are moist.  Cardiovascular:     Rate and Rhythm: Normal rate and regular rhythm.     Pulses: Normal pulses.  Pulmonary:     Effort: Pulmonary effort is normal. No respiratory distress.     Breath sounds: Normal breath sounds.  Abdominal:     General: There is no distension.     Palpations: Abdomen is soft.     Tenderness: There is no abdominal tenderness. There is no guarding.  Musculoskeletal:        General: No swelling or signs of injury. Normal  range of motion.     Cervical back: Normal range of motion.  Skin:    General: Skin is warm.     Capillary Refill: Capillary refill takes less than 2 seconds.     Findings: No rash.  Neurological:     General: No focal deficit present.     Mental Status: She is alert.     Sensory: No sensory deficit.     ED Results / Procedures / Treatments   Labs (all labs ordered are listed, but only abnormal results are displayed) Labs Reviewed  CBG MONITORING, ED - Abnormal; Notable for the following components:  Result Value   Glucose-Capillary 104 (*)    All other components within normal limits    EKG None  Radiology No results found.  Procedures Procedures (including critical care time)  Medications Ordered in ED Medications - No data to display  ED Course  I have reviewed the triage vital signs and the nursing notes.  Pertinent labs & imaging results that were available during my care of the patient were reviewed by me and considered in my medical decision making (see chart for details).    MDM Rules/Calculators/A&P                           Pt is here with concerns regarding her recent positive RPR testing and requesting possible treatment with penicillin.  She denies any symptoms, but admits to unprotected sex with a new sexual partner.  I have counseled her on safe sex practices and risks associated with unprotected intercourse.  I have also given reassurance to her negative FTB testing.  I do not feel that treatment is warranted at this time and she agrees to close PCP f/u next week.  On review of her medical records, it appears that she is taking metformin for PCOS, not diabetes.  Her CBG here was reassuring and I have advised her to continue taking her metformin once daily until her recheck.     Final Clinical Impression(s) / ED Diagnoses Final diagnoses:  Encounter for medical screening examination    Rx / DC Orders ED Discharge Orders    None         Pauline Aus, PA-C 12/13/19 1733    Bethann Berkshire, MD 12/14/19 [phone]

## 2019-12-13 NOTE — Discharge Instructions (Addendum)
Follow-up with Ms Kristin Haynes next week as discussed

## 2019-12-13 NOTE — ED Triage Notes (Signed)
Pt would like to be checked out to see if she is diabetic.  States she had some abnormal labs come back from her pcp.  Denies any symptoms

## 2019-12-23 ENCOUNTER — Other Ambulatory Visit: Payer: Self-pay | Admitting: Family

## 2019-12-23 DIAGNOSIS — N921 Excessive and frequent menstruation with irregular cycle: Secondary | ICD-10-CM

## 2020-01-06 ENCOUNTER — Ambulatory Visit: Payer: Self-pay

## 2020-01-08 ENCOUNTER — Ambulatory Visit (INDEPENDENT_AMBULATORY_CARE_PROVIDER_SITE_OTHER): Payer: Self-pay | Admitting: *Deleted

## 2020-01-08 DIAGNOSIS — N921 Excessive and frequent menstruation with irregular cycle: Secondary | ICD-10-CM

## 2020-01-08 LAB — T4, FREE: Free T4: 1.3 ng/dL (ref 0.8–1.8)

## 2020-01-08 LAB — TSH: TSH: 2.54 mIU/L

## 2020-01-09 NOTE — Progress Notes (Signed)
Patient came in for labs TSH and T4 free. Labs ordered by Bernell List . Successful collection.

## 2020-01-19 ENCOUNTER — Telehealth: Payer: Self-pay | Admitting: Family

## 2020-01-19 ENCOUNTER — Encounter: Payer: Self-pay | Admitting: Family

## 2020-01-22 ENCOUNTER — Ambulatory Visit (INDEPENDENT_AMBULATORY_CARE_PROVIDER_SITE_OTHER): Payer: Self-pay | Admitting: Pediatrics

## 2020-01-22 ENCOUNTER — Encounter: Payer: Self-pay | Admitting: Pediatrics

## 2020-01-22 VITALS — BP 126/84 | HR 66 | Ht 65.55 in | Wt 188.8 lb

## 2020-01-22 DIAGNOSIS — Z3046 Encounter for surveillance of implantable subdermal contraceptive: Secondary | ICD-10-CM

## 2020-01-22 DIAGNOSIS — Z3009 Encounter for other general counseling and advice on contraception: Secondary | ICD-10-CM

## 2020-01-22 DIAGNOSIS — Z30011 Encounter for initial prescription of contraceptive pills: Secondary | ICD-10-CM

## 2020-01-22 DIAGNOSIS — E282 Polycystic ovarian syndrome: Secondary | ICD-10-CM

## 2020-01-22 MED ORDER — DESOGESTREL-ETHINYL ESTRADIOL 0.15-30 MG-MCG PO TABS: 1.0000 | ORAL_TABLET | Freq: Every day | ORAL | 11 refills | Status: DC

## 2020-01-22 NOTE — Progress Notes (Signed)

## 2020-01-22 NOTE — Progress Notes (Signed)
THIS RECORD MAY CONTAIN CONFIDENTIAL INFORMATION THAT SHOULD NOT BE RELEASED WITHOUT REVIEW OF THE SERVICE PROVIDER.  Adolescent Medicine Consultation Follow-Up Visit Kristin Haynes  is a 19 y.o. female referred by No ref. provider found here today for follow-up regarding nexplanon.     Chief complaint: nexplanon removal  HPI:    Nexplanon placed 10/2019. Bleeding for 2 months. Pads/ tampons 3 per day, more this week. A little dizzy sometimes. Reports poor appetite. Cramps, bloating.  Wants pill instead. Reports that she was not great at remembering to take it before. Discussed other options including pill + nexplanon, IUD, shot, patch.   No LMP recorded. Allergies  Allergen Reactions  . Other     Seasonal Allergies      Current Outpatient Medications on File Prior to Visit  Medication Sig Dispense Refill  . albuterol (VENTOLIN HFA) 108 (90 Base) MCG/ACT inhaler INHALE 2 PUFFS INTO THE LUNGS EVERY 6 HOURS AS NEEDED FOR WHEEZE OR SHORTNESS OF BREATH 8.5 g 2  . cyclobenzaprine (FLEXERIL) 5 MG tablet Take 1 tablet (5 mg total) by mouth as needed for muscle spasms. (Patient not taking: Reported on 08/28/2019) 30 tablet 0  . etonogestrel (NEXPLANON) 68 MG IMPL implant 1 each (68 mg total) by Subdermal route once. 1 each 0  . fluconazole (DIFLUCAN) 150 MG tablet Take 1 tablet today and 1 tablet 3 days from now (Patient not taking: Reported on 12/09/2019) 2 tablet 0  . ibuprofen (ADVIL,MOTRIN) 600 MG tablet TAKE 1 TABLET (600 MG TOTAL) BY MOUTH EVERY 6 (SIX) HOURS AS NEEDED FOR HEADACHE. (Patient not taking: Reported on 12/13/2019)  2  . metFORMIN (GLUCOPHAGE XR) 750 MG 24 hr tablet Take 2 tablets (1,500 mg total) by mouth daily with breakfast. 60 tablet 1  . NAPROXEN DR 500 MG EC tablet TAKE 1 TABLET BY MOUTH TWICE A DAY WITH MEALS (Patient not taking: Reported on 12/13/2019) 60 tablet 0  . Norethindrone-Ethinyl Estradiol-Fe Biphas (LO LOESTRIN FE) 1 MG-10 MCG / 10 MCG tablet Take 1 tablet  by mouth daily. (Patient not taking: Reported on 11/04/2019) 28 tablet 11  . Vitamin D, Ergocalciferol, (DRISDOL) 1.25 MG (50000 UNIT) CAPS capsule TAKE 1 CAPSULE (50,000 UNITS TOTAL) BY MOUTH EVERY 7 (SEVEN) DAYS. (Patient not taking: Reported on 12/13/2019) 12 capsule 1  . [DISCONTINUED] albuterol (VENTOLIN HFA) 108 (90 Base) MCG/ACT inhaler Inhale 2 puffs into the lungs every 6 (six) hours as needed for wheezing or shortness of breath. 18 g 0   No current facility-administered medications on file prior to visit.    Patient Active Problem List   Diagnosis Date Noted  . Dysmenorrhea 10/05/2017  . Abdominal pain 03/31/2016  . Hyperlipidemia 01/06/2016  . Vitamin D deficiency 07/20/2015  . PCOS (polycystic ovarian syndrome) 07/18/2015  . Tension headache 05/03/2015  . Migraine without aura and without status migrainosus, not intractable 05/03/2015  . 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 Rhinitis, allergic 04/30/2014  . Mild intermittent asthma 04/30/2014  . Acanthosis nigricans 04/30/2014      Physical Exam:  Vitals:   01/22/20 1059  Height: 5' 5.55" (1.665 m)   Ht 5' 5.55" (1.665 m)   BMI 31.42 kg/m  Body mass index: body mass index is 31.42 kg/m. Growth percentile SmartLinks can only be used for patients less than 45 years old.   Physical Exam  General: well appearing, no distress HEENT: sclera white, mucus membranes moist, no oral lesions CV: RRR, no murmurs, CR 2 sec RESP: no tachypnea,  no increased WOB, lungs CTAB ABD: BS+, soft, nontender, nondistended, no masses EXT: no cyanosis, no swelling. 0.5cm incision on left arm, steri strips in place. NEURO: appropriate mentation, no focal deficits   Assessment/Plan:  1. Nexplanon removal  2. PCOS (polycystic ovarian syndrome) Follow up in 1 month. Discussed risk for pregnancy in next week, instructed to use condoms. - desogestrel-ethinyl estradiol (APRI) 0.15-30 MG-MCG tablet; Take 1 tablet by mouth daily.   Dispense: 28 tablet; Refill: 11   BH screenings:    Follow-up:  8 weeks  Medical decision-making:  >30 minutes spent face to face with patient with more than 50% of appointment spent discussing diagnosis, management, follow-up, and reviewing of chart.

## 2020-01-22 NOTE — Patient Instructions (Addendum)
Start taking OCP today. You are not covered to prevent pregnancy for at least 7 days.   Your Nexplanon was removed today and is no longer preventing pregnancy.  If you have sex, remember to use condoms to prevent pregnancy and to prevent sexually transmitted infections.  Leave the outside bandage on for 24 hours.  Leave the smaller bandages on for 3-5 days or until they fall off on their own.  Keep the area clean and dry for 3-5 days.  There is usually bruising or swelling at and around the removal site for a few days to a week after the removal.  If you see redness or pus draining from the removal site, call us immediately.  We would like you to return to the clinic for a follow-up visit in 1 month.  You can call [facility] 24 hours a day with any questions or concerns.  There is always a nurse or doctor available to take your call.  Call 9-1-1 if you have a life-threatening emergency.  For anything else, please call us at [phone] before heading to the ER.

## 2020-01-24 NOTE — Progress Notes (Signed)
I have reviewed the resident's note and plan of care and helped develop the plan as necessary.  Procedure performed by resident. I was available for assistance. Tolerated well with no complications.   Alfonso Ramus, FNP

## 2020-02-17 ENCOUNTER — Ambulatory Visit
Admission: EM | Admit: 2020-02-17 | Discharge: 2020-02-17 | Disposition: A | Discharge status: Home / Self Care | Payer: BC Managed Care – PPO | Attending: Emergency Medicine | Admitting: Emergency Medicine

## 2020-02-17 ENCOUNTER — Other Ambulatory Visit: Payer: Self-pay

## 2020-02-17 ENCOUNTER — Encounter: Payer: Self-pay | Admitting: Emergency Medicine

## 2020-02-17 DIAGNOSIS — Z113 Encounter for screening for infections with a predominantly sexual mode of transmission: Secondary | ICD-10-CM

## 2020-02-17 NOTE — Discharge Instructions (Addendum)
Vaginal self-swab obtained.  We will follow up with you regarding abnormal results Take medications as prescribed and to completion If tests results are positive, please abstain from sexual activity until you and your partner(s) have been treated Follow up with PCP or [facility] if symptoms persists Return here or go to ER if you have any new or worsening symptoms fever, chills, nausea, vomiting, abdominal or pelvic pain, painful intercourse, vaginal discharge, vaginal bleeding, persistent symptoms despite treatment, etc... 

## 2020-02-17 NOTE — ED Triage Notes (Signed)
Pt would like to be tested for stds. Is having her menstrual cycle at this time.

## 2020-02-17 NOTE — ED Provider Notes (Signed)
[facility]   [id number] 02/17/20 Arrival Time: 1034   Chief Complaint  Patient presents with  . SEXUALLY TRANSMITTED DISEASE     SUBJECTIVE:  Kristin Haynes is a 19 y.o. female who presents with complaints of wanting to look for STDs.  She denies a precipitating event, recent sexual encounter or recent antibiotic use.  Patient is sexually active with 1 female partners.  Denies vaginal discharge or irritation at this time.  Denies aggravating factor.  Denies similar symptoms in the past..  She denies fever, chills, nausea, vomiting, abdominal or pelvic pain, urinary symptoms, vaginal itching, vaginal odor, vaginal bleeding, dyspareunia, vaginal rashes or lesions.   Patient's last menstrual period was 02/10/2020. Current birth control method: Compliant with BC:  ROS: As per HPI.  All other pertinent ROS negative.     Past Medical History:  Diagnosis Date  . Asthma    cough-variant asthma   History reviewed. No pertinent surgical history. Allergies  Allergen Reactions  . Other     Seasonal Allergies      No current facility-administered medications on file prior to encounter.   Current Outpatient Medications on File Prior to Encounter  Medication Sig Dispense Refill  . albuterol (VENTOLIN HFA) 108 (90 Base) MCG/ACT inhaler INHALE 2 PUFFS INTO THE LUNGS EVERY 6 HOURS AS NEEDED FOR WHEEZE OR SHORTNESS OF BREATH 8.5 g 2  . desogestrel-ethinyl estradiol (APRI) 0.15-30 MG-MCG tablet Take 1 tablet by mouth daily. 28 tablet 11  . etonogestrel (NEXPLANON) 68 MG IMPL implant 1 each (68 mg total) by Subdermal route once. 1 each 0  . fluconazole (DIFLUCAN) 150 MG tablet Take 1 tablet today and 1 tablet 3 days from now (Patient not taking: Reported on 12/09/2019) 2 tablet 0  . ibuprofen (ADVIL,MOTRIN) 600 MG tablet TAKE 1 TABLET (600 MG TOTAL) BY MOUTH EVERY 6 (SIX) HOURS AS NEEDED FOR HEADACHE. (Patient not taking: Reported on 12/13/2019)  2  . metFORMIN (GLUCOPHAGE XR) 750 MG  24 hr tablet Take 2 tablets (1,500 mg total) by mouth daily with breakfast. (Patient not taking: Reported on 01/22/2020) 60 tablet 1  . NAPROXEN DR 500 MG EC tablet TAKE 1 TABLET BY MOUTH TWICE A DAY WITH MEALS (Patient not taking: Reported on 01/22/2020) 60 tablet 0  . Vitamin D, Ergocalciferol, (DRISDOL) 1.25 MG (50000 UNIT) CAPS capsule TAKE 1 CAPSULE (50,000 UNITS TOTAL) BY MOUTH EVERY 7 (SEVEN) DAYS. 12 capsule 1  . [DISCONTINUED] albuterol (VENTOLIN HFA) 108 (90 Base) MCG/ACT inhaler Inhale 2 puffs into the lungs every 6 (six) hours as needed for wheezing or shortness of breath. 18 g 0    Social History   Socioeconomic History  . Marital status: Single    Spouse name: Not on file  . Number of children: Not on file  . Years of education: Not on file  . Highest education level: Not on file  Occupational History  . Not on file  Tobacco Use  . Smoking status: Never Smoker  . Smokeless tobacco: Never Used  Substance and Sexual Activity  . Alcohol use: Yes    Alcohol/week: 0.0 standard drinks    Comment: occ  . Drug use: Yes    Types: Marijuana    Comment: daily   . Sexual activity: Yes    Birth control/protection: None  Other Topics Concern  . Not on file  Social History Narrative   [name] is in eleventh grade at [employer]. She is doing well.   Living with mother  and two sisters.  Plans to go to college, thinking about Wellsite geologist and physical therapy      Exercise:  none   Sports:  none   Sleep:  no sleep issues      Confidentiality was discussed with the patient and if applicable, with caregiver as well.      Patient's personal or confidential phone number: [email or web address]   Tobacco?  no   Drugs/ETOH?  no   Partner preference?  female Sexually Active?  no     Pregnancy Prevention:  none, reviewed condoms & plan B   Safe at home, in school & in relationships?  Yes   Safe to self?  Yes    Guns in the home?  yes, Mom and boyfriend keep guns, not  sure where they keep them       Social Determinants of Health   Financial Resource Strain:   . Difficulty of Paying Living Expenses: Not on file  Food Insecurity:   . Worried About Programme researcher, broadcasting/film/video in the Last Year: Not on file  . Ran Out of Food in the Last Year: Not on file  Transportation Needs:   . Lack of Transportation (Medical): Not on file  . Lack of Transportation (Non-Medical): Not on file  Physical Activity:   . Days of Exercise per Week: Not on file  . Minutes of Exercise per Session: Not on file  Stress:   . Feeling of Stress : Not on file  Social Connections:   . Frequency of Communication with Friends and Family: Not on file  . Frequency of Social Gatherings with Friends and Family: Not on file  . Attends Religious Services: Not on file  . Active Member of Clubs or Organizations: Not on file  . Attends Banker Meetings: Not on file  . Marital Status: Not on file  Intimate Partner Violence:   . Fear of Current or Ex-Partner: Not on file  . Emotionally Abused: Not on file  . Physically Abused: Not on file  . Sexually Abused: Not on file   Family History  Problem Relation Age of Onset  . Hyperlipidemia Mother   . Migraines Mother   . Asthma Maternal Grandmother   . Diabetes Maternal Grandmother   . Heart disease Maternal Grandmother        congestive heart failure  . Cancer Maternal Grandmother        breast cancer (negative testing for genetic causes)  . Heart disease Maternal Grandfather 42       MI  . Migraines Maternal Grandfather   . Asthma Sister   . Seizures Other        2 paternal 1st cousins have seizures, maternal 1st cousin had febrile seizures (resolved), MGU has seizures    OBJECTIVE:  Vitals:   02/17/20 1112 02/17/20 1114  BP:  113/74  Pulse:  72  Resp:  18  Temp:  98.7 F (37.1 C)  TempSrc:  Oral  SpO2:  97%  Weight: 184 lb (83.5 kg)   Height: 5\' 4"  (1.626 m)      General appearance: Alert, NAD, appears stated  age Head: NCAT Throat: lips, mucosa, and tongue normal; teeth and gums normal Lungs: CTA bilaterally without adventitious breath sounds Heart: regular rate and rhythm.  Radial pulses 2+ symmetrical bilaterally Back: no CVA tenderness Abdomen: soft, non-tender; bowel sounds normal; no masses or organomegaly; no guarding or rebound tenderness GU:  Cervical self swab obtained Skin: warm and dry Psychological:  Alert and cooperative. Normal mood and affect.  LABS:  Results for orders placed or performed in visit on 01/08/20  T4, free  Result Value Ref Range   Free T4 1.3 0.8 - 1.8 ng/dL  TSH  Result Value Ref Range   TSH 2.54 mIU/L    Labs Reviewed  RPR  CERVICOVAGINAL ANCILLARY ONLY    ASSESSMENT & PLAN:  1. Screening for STD (sexually transmitted disease)     No orders of the defined types were placed in this encounter.   Pending: Labs Reviewed  RPR  CERVICOVAGINAL ANCILLARY ONLY   Discharge instructions  Vaginal self-swab obtained.  We will follow up with you regarding abnormal results Take medications as prescribed and to completion If tests results are positive, please abstain from sexual activity until you and your partner(s) have been treated Follow up with PCP or [facility] if symptoms persists Return here or go to ER if you have any new or worsening symptoms fever, chills, nausea, vomiting, abdominal or pelvic pain, painful intercourse, vaginal discharge, vaginal bleeding, persistent symptoms despite treatment, etc...  Reviewed expectations re: course of current medical issues. Questions answered. Outlined signs and symptoms indicating need for more acute intervention. Patient verbalized understanding. After Visit Summary given.    Note: This document was prepared using Dragon voice recognition software and may include unintentional dictation errors.    Durward Parcel, FNP 02/17/20 1130

## 2020-02-18 ENCOUNTER — Telehealth: Payer: Self-pay | Admitting: Emergency Medicine

## 2020-02-18 LAB — CERVICOVAGINAL ANCILLARY ONLY
Bacterial Vaginitis (gardnerella): NEGATIVE
Candida Glabrata: NEGATIVE
Candida Vaginitis: NEGATIVE
Chlamydia: POSITIVE — AB
Comment: NEGATIVE
Comment: NEGATIVE
Comment: NEGATIVE
Comment: NEGATIVE
Comment: NEGATIVE
Comment: NORMAL
Neisseria Gonorrhea: NEGATIVE
Trichomonas: NEGATIVE

## 2020-02-18 LAB — RPR, QUANT+TP ABS (REFLEX)
Rapid Plasma Reagin, Quant: 1:1 {titer} — ABNORMAL HIGH
T Pallidum Abs: NONREACTIVE

## 2020-02-18 LAB — RPR: RPR Ser Ql: REACTIVE — AB

## 2020-02-18 MED ORDER — AZITHROMYCIN 250 MG PO TABS
1000.0000 mg | ORAL_TABLET | Freq: Once | ORAL | 0 refills | Status: AC
Start: 2020-02-18 — End: 2020-02-18

## 2020-02-18 NOTE — Telephone Encounter (Signed)
Positive for chlamydia, 1 g azithromycin once sent to pharmacy. No intercourse x 1 week. Inform partners.

## 2020-02-19 ENCOUNTER — Ambulatory Visit
Admission: EM | Admit: 2020-02-19 | Discharge: 2020-02-19 | Disposition: A | Discharge status: Home / Self Care | Payer: BC Managed Care – PPO | Attending: Emergency Medicine | Admitting: Emergency Medicine

## 2020-02-19 DIAGNOSIS — R768 Other specified abnormal immunological findings in serum: Secondary | ICD-10-CM

## 2020-02-19 DIAGNOSIS — R739 Hyperglycemia, unspecified: Secondary | ICD-10-CM

## 2020-02-19 LAB — POCT URINALYSIS DIP (MANUAL ENTRY)
Bilirubin, UA: NEGATIVE
Glucose, UA: NEGATIVE mg/dL
Ketones, POC UA: NEGATIVE mg/dL
Leukocytes, UA: NEGATIVE
Nitrite, UA: NEGATIVE
Protein Ur, POC: NEGATIVE mg/dL
Spec Grav, UA: >=1.03 — AB (ref 1.010–1.025)
Urobilinogen, UA: 0.2 E.U./dL
pH, UA: 6 (ref 5.0–8.0)

## 2020-02-19 LAB — POCT FASTING CBG KUC MANUAL ENTRY: POCT Glucose (KUC): 131 mg/dL — AB (ref 70–99)

## 2020-02-19 MED ORDER — PENICILLIN G BENZATHINE 1200000 UNIT/2ML IM SUSP
2.4000 10*6.[IU] | Freq: Once | INTRAMUSCULAR | Status: AC
  Administered 2020-02-19: 2.4 10*6.[IU] via INTRAMUSCULAR

## 2020-02-19 NOTE — Discharge Instructions (Signed)
We treat you for syphillis, I suspect results most likely a false positive, repeat testing in 6 months Blood sugar 131, A1c pending Establish care with primary care for preventative screening/care- [place] [facility], [facility]

## 2020-02-19 NOTE — ED Provider Notes (Signed)
[facility]    CSN: [id number] Arrival date & time: 02/19/20  5176      History   Chief Complaint Chief Complaint  Patient presents with   SEXUALLY TRANSMITTED DISEASE   Hyperglycemia    HPI Kristin Haynes is a 19 y.o. female presenting today for follow-up of abnormal lab result and concern about diabetes.  Patient was recently screened for STDs after having unprotected intercourse.  Had reactive RPR with 1-1 titer.  She had similar results approximately 2 months ago.  She is concerned that her test is still positive and would like to be treated for syphilis.  She denies any rash or lesions.  She also expresses concern around diabetes.  Reports that had slightly elevated A1c previously when diagnosed with PCOS.  Of recently she has had urinary frequency as well as decreased appetite and would like to have her blood sugar checked.  She did eat some crackers prior to arrival, but no large meal.  HPI  Past Medical History:  Diagnosis Date   Asthma    cough-variant asthma    Patient Active Problem List   Diagnosis Date Noted   Dysmenorrhea 10/05/2017   Abdominal pain 03/31/2016   Hyperlipidemia 01/06/2016   Vitamin D deficiency 07/20/2015   PCOS (polycystic ovarian syndrome) 07/18/2015   Tension headache 05/03/2015   Migraine without aura and without status migrainosus, not intractable 05/03/2015   Obesity peds (BMI >=95 percentile) 04/30/2014   Rhinitis, allergic 04/30/2014   Mild intermittent asthma 04/30/2014   Acanthosis nigricans 04/30/2014    History reviewed. No pertinent surgical history.  OB History   No obstetric history on file.      Home Medications    Prior to Admission medications   Medication Sig Start Date End Date Taking? Authorizing Provider  albuterol (VENTOLIN HFA) 108 (90 Base) MCG/ACT inhaler INHALE 2 PUFFS INTO THE LUNGS EVERY 6 HOURS AS NEEDED FOR WHEEZE OR SHORTNESS OF BREATH 11/24/19   Verneda Skill, FNP    desogestrel-ethinyl estradiol (APRI) 0.15-30 MG-MCG tablet Take 1 tablet by mouth daily. 01/22/20   Verneda Skill, FNP  etonogestrel (NEXPLANON) 68 MG IMPL implant 1 each (68 mg total) by Subdermal route once.    Verneda Skill, FNP  fluconazole (DIFLUCAN) 150 MG tablet Take 1 tablet today and 1 tablet 3 days from now Patient not taking: Reported on 12/09/2019 11/05/19   Alfonso Ramus T, FNP  ibuprofen (ADVIL,MOTRIN) 600 MG tablet TAKE 1 TABLET (600 MG TOTAL) BY MOUTH EVERY 6 (SIX) HOURS AS NEEDED FOR HEADACHE. Patient not taking: Reported on 12/13/2019 03/18/16   [provider]  metFORMIN (GLUCOPHAGE XR) 750 MG 24 hr tablet Take 2 tablets (1,500 mg total) by mouth daily with breakfast. Patient not taking: Reported on 01/22/2020 11/04/19   Alfonso Ramus T, FNP  NAPROXEN DR 500 MG EC tablet TAKE 1 TABLET BY MOUTH TWICE A DAY WITH MEALS Patient not taking: Reported on 01/22/2020 04/24/18   Alfonso Ramus T, FNP  Vitamin D, Ergocalciferol, (DRISDOL) 1.25 MG (50000 UNIT) CAPS capsule TAKE 1 CAPSULE (50,000 UNITS TOTAL) BY MOUTH EVERY 7 (SEVEN) DAYS. 10/02/19   Verneda Skill, FNP  albuterol (VENTOLIN HFA) 108 (90 Base) MCG/ACT inhaler Inhale 2 puffs into the lungs every 6 (six) hours as needed for wheezing or shortness of breath. 10/29/19   Verneda Skill, FNP    Family History Family History  Problem Relation Age of Onset   Hyperlipidemia Mother    Migraines Mother  Asthma Maternal Grandmother    Diabetes Maternal Grandmother    Heart disease Maternal Grandmother        congestive heart failure   Cancer Maternal Grandmother        breast cancer (negative testing for genetic causes)   Heart disease Maternal Grandfather 21       MI   Migraines Maternal Grandfather    Asthma Sister    Seizures Other        2 paternal 1st cousins have seizures, maternal 1st cousin had febrile seizures (resolved), MGU has seizures    Social History Social History    Tobacco Use   Smoking status: Never Smoker   Smokeless tobacco: Never Used  Substance Use Topics   Alcohol use: Yes    Alcohol/week: 0.0 standard drinks    Comment: occ   Drug use: Yes    Types: Marijuana    Comment: daily      Allergies   Other   Review of Systems Review of Systems  Constitutional: Positive for appetite change. Negative for fever.  Respiratory: Negative for shortness of breath.   Cardiovascular: Negative for chest pain.  Gastrointestinal: Negative for abdominal pain, diarrhea, nausea and vomiting.  Genitourinary: Positive for frequency. Negative for dysuria, flank pain, genital sores, hematuria, menstrual problem, vaginal bleeding, vaginal discharge and vaginal pain.  Musculoskeletal: Negative for back pain.  Skin: Negative for rash.  Neurological: Negative for dizziness, light-headedness and headaches.     Physical Exam Triage Vital Signs ED Triage Vitals [02/19/20 0918]  Enc Vitals Group     BP      Pulse      Resp      Temp      Temp src      SpO2      Weight      Height      Head Circumference      Peak Flow      Pain Score 0     Pain Loc      Pain Edu?      Excl. in GC?    No data found.  Updated Vital Signs BP 110/78    Pulse 77    Temp 98.2 F (36.8 C)    Resp 20    LMP 02/03/2020 (Approximate)    SpO2 96%   Visual Acuity Right Eye Distance:   Left Eye Distance:   Bilateral Distance:    Right Eye Near:   Left Eye Near:    Bilateral Near:     Physical Exam Vitals and nursing note reviewed.  Constitutional:      Appearance: She is well-developed.     Comments: No acute distress  HENT:     Head: Normocephalic and atraumatic.     Nose: Nose normal.  Eyes:     Conjunctiva/sclera: Conjunctivae normal.  Cardiovascular:     Rate and Rhythm: Normal rate.  Pulmonary:     Effort: Pulmonary effort is normal. No respiratory distress.  Abdominal:     General: There is no distension.  Musculoskeletal:        General:  Normal range of motion.     Cervical back: Neck supple.  Skin:    General: Skin is warm and dry.  Neurological:     Mental Status: She is alert and oriented to person, place, and time.      UC Treatments / Results  Labs (all labs ordered are listed, but only abnormal results are displayed) Labs Reviewed  POCT  URINALYSIS DIP (MANUAL ENTRY) - Abnormal; Notable for the following components:      Result Value   Spec Grav, UA >=1.030 (*)    Blood, UA small (*)    All other components within normal limits  POCT FASTING CBG [facility] MANUAL ENTRY - Abnormal; Notable for the following components:   POCT Glucose ([facility]) 131 (*)    All other components within normal limits  HEMOGLOBIN A1C    EKG   Radiology No results found.  Procedures Procedures (including critical care time)  Medications Ordered in UC Medications  penicillin g benzathine (BICILLIN LA) 1200000 UNIT/2ML injection 2.4 Million Units (2.4 Million Units Intramuscular Given 02/19/20 0955)    Initial Impression / Assessment and Plan / UC Course  I have reviewed the triage vital signs and the nursing notes.  Pertinent labs & imaging results that were available during my care of the patient were reviewed by me and considered in my medical decision making (see chart for details).     1.  Hyperglycemia-blood sugar 131, will check A1c, but discussed following up with primary care for further preventative care and management if elevated.  2.  Reactive RPR-suspect likely still false positive given low titer, discussed with patient risk versus benefit treatment.  Opted to go ahead and treat for syphilis today with Bicillin 2.4.  Recommended rechecking RPR in 6 months.  Discussed strict return precautions. Patient verbalized understanding and is agreeable with plan.  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   Sherica Paternostro, [place] C, PA-C 02/19/20 1004

## 2020-02-19 NOTE — ED Triage Notes (Signed)
Pt here for treatment for syphilis, rpr is positive

## 2020-02-20 LAB — HEMOGLOBIN A1C
Est. average glucose Bld gHb Est-mCnc: 100 mg/dL
Hgb A1c MFr Bld: 5.1 % (ref 4.8–5.6)

## 2020-03-01 ENCOUNTER — Ambulatory Visit: Payer: Self-pay | Admitting: Family

## 2020-03-02 ENCOUNTER — Other Ambulatory Visit: Payer: Self-pay

## 2020-03-02 ENCOUNTER — Ambulatory Visit
Admission: EM | Admit: 2020-03-02 | Discharge: 2020-03-02 | Disposition: A | Discharge status: Home / Self Care | Payer: BC Managed Care – PPO | Attending: Family Medicine | Admitting: Family Medicine

## 2020-03-02 ENCOUNTER — Encounter: Payer: Self-pay | Admitting: Emergency Medicine

## 2020-03-02 DIAGNOSIS — Z8619 Personal history of other infectious and parasitic diseases: Secondary | ICD-10-CM | POA: Insufficient documentation

## 2020-03-02 DIAGNOSIS — Z113 Encounter for screening for infections with a predominantly sexual mode of transmission: Secondary | ICD-10-CM | POA: Diagnosis not present

## 2020-03-02 DIAGNOSIS — R350 Frequency of micturition: Secondary | ICD-10-CM | POA: Diagnosis not present

## 2020-03-02 LAB — POCT URINALYSIS DIP (MANUAL ENTRY)
Bilirubin, UA: NEGATIVE
Blood, UA: NEGATIVE
Glucose, UA: NEGATIVE mg/dL
Ketones, POC UA: NEGATIVE mg/dL
Leukocytes, UA: NEGATIVE
Nitrite, UA: NEGATIVE
Protein Ur, POC: NEGATIVE mg/dL
Spec Grav, UA: 1.025 (ref 1.010–1.025)
Urobilinogen, UA: 1 E.U./dL
pH, UA: 6.5 (ref 5.0–8.0)

## 2020-03-02 LAB — POCT URINE PREGNANCY: Preg Test, Ur: NEGATIVE

## 2020-03-02 NOTE — Discharge Instructions (Addendum)
You may have a urinary tract infection.   We are going to culture your urine and will call you as soon as we have the results.   Drink plenty of water, 8-10 glasses per day.   Your swab testing will be back in about 2 days. Do not have sex until tests are back and negative or treatment is completed.  Follow up with your primary care provider as needed.   Go to the Emergency Department if you experience severe pain, shortness of breath, high fever, or other concerns.

## 2020-03-02 NOTE — ED Triage Notes (Signed)
Pt reports frequent urination x 1 month.  Denies any burning with urination.

## 2020-03-03 LAB — CERVICOVAGINAL ANCILLARY ONLY
Bacterial Vaginitis (gardnerella): NEGATIVE
Candida Glabrata: NEGATIVE
Candida Vaginitis: NEGATIVE
Chlamydia: NEGATIVE
Comment: NEGATIVE
Comment: NEGATIVE
Comment: NEGATIVE
Comment: NEGATIVE
Comment: NEGATIVE
Comment: NORMAL
Neisseria Gonorrhea: NEGATIVE
Trichomonas: NEGATIVE

## 2020-03-03 LAB — URINE CULTURE

## 2020-03-03 LAB — HIV ANTIBODY (ROUTINE TESTING W REFLEX): HIV Screen 4th Generation wRfx: NONREACTIVE

## 2020-03-03 NOTE — ED Provider Notes (Addendum)
[facility]   CC: UTI  SUBJECTIVE:  Kristin Haynes is a 19 y.o. female who complains of urinary frequency, urgency for on and off for the last month.  Denies dysuria.  Reports that she has been experiencing urinary frequency since she was seen in this office and treated for chlamydia about a month and a half ago.  Patient denies a precipitating event, recent sexual encounter, excessive caffeine intake.  Requesting to be STD tested today.  Has not attempted OTC treatment for this.  There are no aggravating or alleviating factors.  Admits to similar symptoms in the past.  Denies fever, chills, nausea, vomiting, abdominal pain, flank pain, abnormal vaginal discharge or bleeding, hematuria.    LMP: Patient's last menstrual period was 02/10/2020.  ROS: As in HPI.  All other pertinent ROS negative.     Past Medical History:  Diagnosis Date  . Asthma    cough-variant asthma   History reviewed. No pertinent surgical history. Allergies  Allergen Reactions  . Other     Seasonal Allergies      No current facility-administered medications on file prior to encounter.   Current Outpatient Medications on File Prior to Encounter  Medication Sig Dispense Refill  . albuterol (VENTOLIN HFA) 108 (90 Base) MCG/ACT inhaler INHALE 2 PUFFS INTO THE LUNGS EVERY 6 HOURS AS NEEDED FOR WHEEZE OR SHORTNESS OF BREATH 8.5 g 2  . desogestrel-ethinyl estradiol (APRI) 0.15-30 MG-MCG tablet Take 1 tablet by mouth daily. 28 tablet 11  . etonogestrel (NEXPLANON) 68 MG IMPL implant 1 each (68 mg total) by Subdermal route once. 1 each 0  . fluconazole (DIFLUCAN) 150 MG tablet Take 1 tablet today and 1 tablet 3 days from now (Patient not taking: Reported on 12/09/2019) 2 tablet 0  . ibuprofen (ADVIL,MOTRIN) 600 MG tablet TAKE 1 TABLET (600 MG TOTAL) BY MOUTH EVERY 6 (SIX) HOURS AS NEEDED FOR HEADACHE. (Patient not taking: Reported on 12/13/2019)  2  . metFORMIN (GLUCOPHAGE XR) 750 MG 24 hr tablet Take 2  tablets (1,500 mg total) by mouth daily with breakfast. (Patient not taking: Reported on 01/22/2020) 60 tablet 1  . NAPROXEN DR 500 MG EC tablet TAKE 1 TABLET BY MOUTH TWICE A DAY WITH MEALS (Patient not taking: Reported on 01/22/2020) 60 tablet 0  . Vitamin D, Ergocalciferol, (DRISDOL) 1.25 MG (50000 UNIT) CAPS capsule TAKE 1 CAPSULE (50,000 UNITS TOTAL) BY MOUTH EVERY 7 (SEVEN) DAYS. 12 capsule 1  . [DISCONTINUED] albuterol (VENTOLIN HFA) 108 (90 Base) MCG/ACT inhaler Inhale 2 puffs into the lungs every 6 (six) hours as needed for wheezing or shortness of breath. 18 g 0   Social History   Socioeconomic History  . Marital status: Single    Spouse name: Not on file  . Number of children: Not on file  . Years of education: Not on file  . Highest education level: Not on file  Occupational History  . Not on file  Tobacco Use  . Smoking status: Never Smoker  . Smokeless tobacco: Never Used  Substance and Sexual Activity  . Alcohol use: Yes    Alcohol/week: 0.0 standard drinks    Comment: occ  . Drug use: Yes    Types: Marijuana    Comment: daily   . Sexual activity: Yes    Birth control/protection: None  Other Topics Concern  . Not on file  Social History Narrative   [name] is in eleventh grade at [employer]. She is doing well.   Living  with mother and two sisters.  Plans to go to college, thinking about Wellsite geologist and physical therapy      Exercise:  none   Sports:  none   Sleep:  no sleep issues      Confidentiality was discussed with the patient and if applicable, with caregiver as well.      Patient's personal or confidential phone number: [email or web address]   Tobacco?  no   Drugs/ETOH?  no   Partner preference?  female Sexually Active?  no     Pregnancy Prevention:  none, reviewed condoms & plan B   Safe at home, in school & in relationships?  Yes   Safe to self?  Yes    Guns in the home?  yes, Mom and boyfriend keep guns, not sure where they keep  them       Social Determinants of Health   Financial Resource Strain:   . Difficulty of Paying Living Expenses: Not on file  Food Insecurity:   . Worried About Programme researcher, broadcasting/film/video in the Last Year: Not on file  . Ran Out of Food in the Last Year: Not on file  Transportation Needs:   . Lack of Transportation (Medical): Not on file  . Lack of Transportation (Non-Medical): Not on file  Physical Activity:   . Days of Exercise per Week: Not on file  . Minutes of Exercise per Session: Not on file  Stress:   . Feeling of Stress : Not on file  Social Connections:   . Frequency of Communication with Friends and Family: Not on file  . Frequency of Social Gatherings with Friends and Family: Not on file  . Attends Religious Services: Not on file  . Active Member of Clubs or Organizations: Not on file  . Attends Banker Meetings: Not on file  . Marital Status: Not on file  Intimate Partner Violence:   . Fear of Current or Ex-Partner: Not on file  . Emotionally Abused: Not on file  . Physically Abused: Not on file  . Sexually Abused: Not on file   Family History  Problem Relation Age of Onset  . Hyperlipidemia Mother   . Migraines Mother   . Asthma Maternal Grandmother   . Diabetes Maternal Grandmother   . Heart disease Maternal Grandmother        congestive heart failure  . Cancer Maternal Grandmother        breast cancer (negative testing for genetic causes)  . Heart disease Maternal Grandfather 42       MI  . Migraines Maternal Grandfather   . Asthma Sister   . Seizures Other        2 paternal 1st cousins have seizures, maternal 1st cousin had febrile seizures (resolved), MGU has seizures    OBJECTIVE:  Vitals:   03/02/20 1819 03/02/20 1821  BP: 132/83   Pulse: 73   Resp: 17   Temp: 98.5 F (36.9 C)   TempSrc: Oral   SpO2: 96%   Weight:  184 lb (83.5 kg)  Height:  5\' 4"  (1.626 m)   General appearance: AOx3 in no acute distress HEENT: NCAT. Oropharynx  clear.  Lungs: clear to auscultation bilaterally without adventitious breath sounds Heart: regular rate and rhythm. Radial pulses 2+ symmetrical bilaterally Abdomen: soft; non-distended; no tenderness; bowel sounds present; no guarding or rebound tenderness Back: no CVA tenderness Extremities: no edema; symmetrical with no gross deformities Skin: warm and dry GU: deferred Neurologic: Ambulates from chair to  exam table without difficulty Psychological: alert and cooperative; normal mood and affect  Labs Reviewed  URINE CULTURE  HIV ANTIBODY (ROUTINE TESTING W REFLEX)   Narrative:    Performed at:  [address] [place] [address], [place], [place]  [id number] Lab Director: Jolene Schimke MD, Phone:  [phone]  POCT URINALYSIS DIP (MANUAL ENTRY)  POCT URINE PREGNANCY  CERVICOVAGINAL ANCILLARY ONLY    ASSESSMENT & PLAN:  1. Urinary frequency   2. Screen for STD (sexually transmitted disease)     Urine culture sent  Pregnancy test negative in office today Vaginal swab obtained HIV testing today as well Had positive RPR with low titer at last test, will need retesting in about 5 months from now We will call you with abnormal results that need further treatment Push fluids and get plenty of rest Take antibiotic as directed and to completion Take pyridium as prescribed and as needed for symptomatic relief Follow up with PCP if symptoms persists Return here or go to ER if you have any new or worsening symptoms such as fever, worsening abdominal pain, nausea/vomiting, flank pain  Outlined signs and symptoms indicating need for more acute intervention Patient verbalized understanding After Visit Summary given     Moshe Cipro, NP 03/03/20 0846    Moshe Cipro, NP 03/03/20 [phone]

## 2020-03-24 ENCOUNTER — Encounter: Payer: Self-pay | Admitting: Family

## 2020-03-24 ENCOUNTER — Telehealth (INDEPENDENT_AMBULATORY_CARE_PROVIDER_SITE_OTHER): Payer: Self-pay | Admitting: Family

## 2020-03-24 DIAGNOSIS — F410 Panic disorder [episodic paroxysmal anxiety] without agoraphobia: Secondary | ICD-10-CM

## 2020-03-24 DIAGNOSIS — F4323 Adjustment disorder with mixed anxiety and depressed mood: Secondary | ICD-10-CM

## 2020-03-24 DIAGNOSIS — G479 Sleep disorder, unspecified: Secondary | ICD-10-CM

## 2020-03-24 MED ORDER — ESCITALOPRAM OXALATE 10 MG PO TABS: 10.0000 mg | ORAL_TABLET | Freq: Every day | ORAL | 0 refills | Status: DC

## 2020-03-24 NOTE — Progress Notes (Signed)
This note is not being shared with the patient for the following reason: To respect privacy (The patient or proxy has requested that the information not be shared).  THIS RECORD MAY CONTAIN CONFIDENTIAL INFORMATION THAT SHOULD NOT BE RELEASED WITHOUT REVIEW OF THE SERVICE PROVIDER.  Virtual Follow-Up Visit via Video Note  I connected with Kristin Haynes  on 03/24/20 at  3:30 PM EDT by a video enabled telemedicine application and verified that I am speaking with the correct person using two identifiers.   Patient/parent location: home   I discussed the limitations of evaluation and management by telemedicine and the availability of in person appointments.  I discussed that the purpose of this telehealth visit is to provide medical care while limiting exposure to the novel coronavirus.  The patient expressed understanding and agreed to proceed.   Kristin Haynes is a 19 y.o. female referred by No ref. provider found here today for follow-up of depressive symptoms.   History was provided by the patient.  Plan from Last Visit:   Nexplanon removed; started desogestrel-ethinyl estradiol 0.15-30 full year prescribed   Chief Complaint: -depressed mood  History of Present Illness:  -stopped taking OCP because she thought it would help her depression -made her symptoms worse -feels anxious and sad -has had passive SI; mom is protective factor; doesn't want to hurt her or cause hurt -she is open to therapy and medication   ROS   Allergies  Allergen Reactions  . Other     Seasonal Allergies      Outpatient Medications Prior to Visit  Medication Sig Dispense Refill  . albuterol (VENTOLIN HFA) 108 (90 Base) MCG/ACT inhaler INHALE 2 PUFFS INTO THE LUNGS EVERY 6 HOURS AS NEEDED FOR WHEEZE OR SHORTNESS OF BREATH 8.5 g 2  . desogestrel-ethinyl estradiol (APRI) 0.15-30 MG-MCG tablet Take 1 tablet by mouth daily. 28 tablet 11  . etonogestrel (NEXPLANON) 68 MG IMPL implant 1 each (68 mg  total) by Subdermal route once. 1 each 0  . fluconazole (DIFLUCAN) 150 MG tablet Take 1 tablet today and 1 tablet 3 days from now (Patient not taking: Reported on 12/09/2019) 2 tablet 0  . ibuprofen (ADVIL,MOTRIN) 600 MG tablet TAKE 1 TABLET (600 MG TOTAL) BY MOUTH EVERY 6 (SIX) HOURS AS NEEDED FOR HEADACHE. (Patient not taking: Reported on 12/13/2019)  2  . metFORMIN (GLUCOPHAGE XR) 750 MG 24 hr tablet Take 2 tablets (1,500 mg total) by mouth daily with breakfast. (Patient not taking: Reported on 01/22/2020) 60 tablet 1  . NAPROXEN DR 500 MG EC tablet TAKE 1 TABLET BY MOUTH TWICE A DAY WITH MEALS (Patient not taking: Reported on 01/22/2020) 60 tablet 0  . Vitamin D, Ergocalciferol, (DRISDOL) 1.25 MG (50000 UNIT) CAPS capsule TAKE 1 CAPSULE (50,000 UNITS TOTAL) BY MOUTH EVERY 7 (SEVEN) DAYS. 12 capsule 1   No facility-administered medications prior to visit.     Patient Active Problem List   Diagnosis Date Noted  . Dysmenorrhea 10/05/2017  . Abdominal pain 03/31/2016  . Hyperlipidemia 01/06/2016  . Vitamin D deficiency 07/20/2015  . PCOS (polycystic ovarian syndrome) 07/18/2015  . Tension headache 05/03/2015  . Migraine without aura and without status migrainosus, not intractable 05/03/2015  . 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 Rhinitis, allergic 04/30/2014  . Mild intermittent asthma 04/30/2014  . Acanthosis nigricans 04/30/2014   The following portions of the patient's history were reviewed and updated as appropriate: allergies, current medications, past family history, past medical history, past social history, past  surgical history and problem list.  PHQ-SADS Last 3 Score only 03/24/2020 07/20/2015  PHQ-15 Score 15 -  Total GAD-7 Score 18 -  PHQ-9 Total Score 21 5    Visual Observations/Objective:   General Appearance: Well nourished well developed, in no apparent distress.  Eyes: conjunctiva no swelling or erythema ENT/Mouth: No hoarseness, No cough for duration of  visit.  Neck: Supple  Respiratory: Respiratory effort normal, normal rate, no retractions or distress.   Cardio: Appears well-perfused, noncyanotic Musculoskeletal: no obvious deformity Skin: visible skin without rashes, ecchymosis, erythema Neuro: Awake and oriented X 3,  Psych:  tearful affect, Insight and Judgment appropriate.    Assessment/Plan: 1. Adjustment disorder with mixed anxiety and depressed mood 2. Panic attacks 3. Sleep disturbance -Lexapro 10 mg - Amb ref to [facility]  19 yo A/I female presents with concerns of increased depressive symptoms. Upon review of PHQSADS questionnaire, anxiety and depressive symptoms are present, as well as panic attacks and sleep disturbance. Significant somatic symptom also. Reviewed SSRI use, MOA, efficacy, adverse effects. She elects to try SSRI and I am electing Lexapro as studies have shown it to be a fast-acting SSRI with more rapid onset of effect than other SSRIs Kristin Haynes et al, 2006)  Recommended urgent [facility] referral to initiate therapy.   BH screenings:  PHQ-SADS Last 3 Score only 03/24/2020 07/20/2015  PHQ-15 Score 15 -  Total GAD-7 Score 18 -  PHQ-9 Total Score 21 5    Screens discussed with patient and parent and adjustments to plan made accordingly.   I discussed the assessment and treatment plan with the patient and/or parent/guardian.  They were provided an opportunity to ask questions and all were answered.  They agreed with the plan and demonstrated an understanding of the instructions. They were advised to call back or seek an in-person evaluation in the emergency room if the symptoms worsen or if the condition fails to improve as anticipated.   Follow-up:  2 weeks, BH before if possible   Medical decision-making:   I spent 30 minutes on this telehealth visit inclusive of face-to-face video and care coordination time I was located remote during this encounter.   Georges Mouse, NP    CC:  Patient, No Pcp Per, No ref. provider found

## 2020-03-26 ENCOUNTER — Encounter: Payer: Self-pay | Admitting: Family

## 2020-04-08 ENCOUNTER — Encounter: Payer: Self-pay | Admitting: Licensed Clinical Social Worker

## 2020-04-08 ENCOUNTER — Ambulatory Visit: Payer: Self-pay | Admitting: Family

## 2020-04-16 ENCOUNTER — Other Ambulatory Visit: Payer: Self-pay | Admitting: Family

## 2020-04-16 DIAGNOSIS — F4323 Adjustment disorder with mixed anxiety and depressed mood: Secondary | ICD-10-CM

## 2020-04-16 DIAGNOSIS — G479 Sleep disorder, unspecified: Secondary | ICD-10-CM

## 2020-04-16 DIAGNOSIS — F410 Panic disorder [episodic paroxysmal anxiety] without agoraphobia: Secondary | ICD-10-CM

## 2020-04-28 ENCOUNTER — Ambulatory Visit
Admission: EM | Admit: 2020-04-28 | Discharge: 2020-04-28 | Disposition: A | Discharge status: Home / Self Care | Payer: BC Managed Care – PPO | Attending: Emergency Medicine | Admitting: Emergency Medicine

## 2020-04-28 ENCOUNTER — Other Ambulatory Visit: Payer: Self-pay

## 2020-04-28 DIAGNOSIS — Z1152 Encounter for screening for COVID-19: Secondary | ICD-10-CM

## 2020-04-28 NOTE — ED Triage Notes (Signed)
Needs covid test

## 2020-04-29 LAB — SARS-COV-2, NAA 2 DAY TAT

## 2020-04-29 LAB — NOVEL CORONAVIRUS, NAA: SARS-CoV-2, NAA: DETECTED — AB

## 2020-04-30 ENCOUNTER — Telehealth (HOSPITAL_COMMUNITY): Payer: Self-pay

## 2020-04-30 NOTE — Telephone Encounter (Signed)
Called to Discuss with patient about Covid symptoms and the use of the monoclonal antibody infusion for those with mild to moderate Covid symptoms and at a high risk of hospitalization.     Pt appears to qualify for this infusion due to co-morbid conditions and/or a member of an at-risk group in accordance with the FDA Emergency Use Authorization.    Risk factors include high risk racial group and BMI >25.  Pt declined monoclonal antibody treatment, denies further needs.

## 2020-07-29 ENCOUNTER — Ambulatory Visit
Admission: EM | Admit: 2020-07-29 | Discharge: 2020-07-29 | Disposition: A | Discharge status: Home / Self Care | Payer: BC Managed Care – PPO | Attending: Emergency Medicine | Admitting: Emergency Medicine

## 2020-07-29 ENCOUNTER — Encounter: Payer: Self-pay | Admitting: Emergency Medicine

## 2020-07-29 ENCOUNTER — Other Ambulatory Visit: Payer: Self-pay

## 2020-07-29 DIAGNOSIS — Z202 Contact with and (suspected) exposure to infections with a predominantly sexual mode of transmission: Secondary | ICD-10-CM | POA: Diagnosis present

## 2020-07-29 NOTE — Discharge Instructions (Addendum)
Vaginal self swab or Urine cytology obtained  HIV and syphilis testing ordered We will follow up with you regarding the results of your test If tests are positive, please abstain from sexual activity until you and your partner(s) are treated Follow up with PCP or [facility] if symptoms persists Return here or go to ER if you have any new or worsening symptoms

## 2020-07-29 NOTE — ED Triage Notes (Signed)
Wants to be tested for all std's. No s/s and no known exposure. Would just like to be tested.

## 2020-07-29 NOTE — ED Provider Notes (Signed)
[facility]   [id number] 07/29/20 Arrival Time: 1742   JJ:HERDEYC FOR STD  SUBJECTIVE:  Kristin Haynes is a 20 y.o. female who presents requesting STI screening.  Currently asymptomatic.  Partner asymptomatic.  Last unprotected sexual encounter within few days.  Sexually active with partner.  Denies similar symptoms in the past.  Denies fever, chills, nausea, vomiting, abdominal or pelvic pain, urinary symptoms, vaginal itching, vaginal odor, vaginal bleeding, dyspareunia, vaginal rashes or lesions.    Patient's last menstrual period was 07/23/2020.  ROS: As per HPI.  All other pertinent ROS negative.     Past Medical History:  Diagnosis Date  . Asthma    cough-variant asthma   History reviewed. No pertinent surgical history. Allergies  Allergen Reactions  . Other     Seasonal Allergies      No current facility-administered medications on file prior to encounter.   Current Outpatient Medications on File Prior to Encounter  Medication Sig Dispense Refill  . albuterol (VENTOLIN HFA) 108 (90 Base) MCG/ACT inhaler INHALE 2 PUFFS INTO THE LUNGS EVERY 6 HOURS AS NEEDED FOR WHEEZE OR SHORTNESS OF BREATH 8.5 g 2  . desogestrel-ethinyl estradiol (APRI) 0.15-30 MG-MCG tablet Take 1 tablet by mouth daily. 28 tablet 11  . escitalopram (LEXAPRO) 10 MG tablet TAKE 1 TABLET BY MOUTH EVERY DAY 90 tablet 1  . Vitamin D, Ergocalciferol, (DRISDOL) 1.25 MG (50000 UNIT) CAPS capsule TAKE 1 CAPSULE (50,000 UNITS TOTAL) BY MOUTH EVERY 7 (SEVEN) DAYS. 12 capsule 1   Social History   Socioeconomic History  . Marital status: Single    Spouse name: Not on file  . Number of children: Not on file  . Years of education: Not on file  . Highest education level: Not on file  Occupational History  . Not on file  Tobacco Use  . Smoking status: Never Smoker  . Smokeless tobacco: Never Used  Substance and Sexual Activity  . Alcohol use: Yes    Alcohol/week: 0.0 standard drinks     Comment: occ  . Drug use: Yes    Types: Marijuana    Comment: daily   . Sexual activity: Yes    Birth control/protection: None  Other Topics Concern  . Not on file  Social History Narrative   Kristin Haynes is in eleventh grade at [employer]. She is doing well.   Living with mother and two sisters.  Plans to go to college, thinking about [employer] and physical therapy      Exercise:  none   Sports:  none   Sleep:  no sleep issues      Confidentiality was discussed with the patient and if applicable, with caregiver as well.      Patient's personal or confidential phone number: [email or web address]   Tobacco?  no   Drugs/ETOH?  no   Partner preference?  female Sexually Active?  no     Pregnancy Prevention:  none, reviewed condoms & plan B   Safe at home, in school & in relationships?  Yes   Safe to self?  Yes    Guns in the home?  yes, Mom and boyfriend keep guns, not sure where they keep them       Social Determinants of Health   Financial Resource Strain: Not on file  Food Insecurity: Not on file  Transportation Needs: Not on file  Physical Activity: Not on file  Stress: Not on file  Social Connections: Not on file  Intimate Partner Violence:  Not on file   Family History  Problem Relation Age of Onset  . Hyperlipidemia Mother   . Migraines Mother   . Asthma Maternal Grandmother   . Diabetes Maternal Grandmother   . Heart disease Maternal Grandmother        congestive heart failure  . Cancer Maternal Grandmother        breast cancer (negative testing for genetic causes)  . Heart disease Maternal Grandfather 42       MI  . Migraines Maternal Grandfather   . Asthma Sister   . Seizures Other        2 paternal 1st cousins have seizures, maternal 1st cousin had febrile seizures (resolved), MGU has seizures    OBJECTIVE:  Vitals:   07/29/20 1751 07/29/20 1753  BP:  103/68  Pulse: 83   Resp: 17   Temp: 98.9 F (37.2 C)   TempSrc: Oral    SpO2: 98%      General appearance: alert, NAD, appears stated age Head: NCAT Throat: lips, mucosa, and tongue normal; teeth and gums normal Lungs: CTA bilaterally without adventitious breath sounds Heart: regular rate and rhythm.  Radial pulses 2+ symmetrical bilaterally Back: no CVA tenderness Abdomen: soft, non-tender; bowel sounds normal; no masses or organomegaly; no guarding or rebound tenderness GU: deferred Skin: warm and dry Psychological:  Alert and cooperative. Normal mood and affect.  LABS:  Results for orders placed or performed during the hospital encounter of 04/28/20  Novel Coronavirus, NAA ([facility])   Specimen: Nasopharyngeal(NP) swabs in vial transport medium   Nasopharynge  Patient  Result Value Ref Range   SARS-CoV-2, NAA Detected (A) Not Detected  SARS-COV-2, NAA 2 DAY TAT   Nasopharynge  Patient  Result Value Ref Range   SARS-CoV-2, NAA 2 DAY TAT Performed     Labs Reviewed  RPR  HIV ANTIBODY (ROUTINE TESTING W REFLEX)  CERVICOVAGINAL ANCILLARY ONLY    ASSESSMENT & PLAN:  1. Exposure to STD     No orders of the defined types were placed in this encounter.   Pending: Labs Reviewed  RPR  HIV ANTIBODY (ROUTINE TESTING W REFLEX)  CERVICOVAGINAL ANCILLARY ONLY    Discharge instructions  Vaginal self swab or Urine cytology obtained  HIV and syphilis testing ordered We will follow up with you regarding the results of your test If tests are positive, please abstain from sexual activity until you and your partner(s) are treated Follow up with PCP or [facility] if symptoms persists Return here or go to ER if you have any new or worsening symptoms    Reviewed expectations re: course of current medical issues. Questions answered. Outlined signs and symptoms indicating need for more acute intervention. Patient verbalized understanding. After Visit Summary given.       Durward Parcel, FNP 07/29/20 1800

## 2020-07-30 LAB — CERVICOVAGINAL ANCILLARY ONLY
Bacterial Vaginitis (gardnerella): NEGATIVE
Candida Glabrata: NEGATIVE
Candida Vaginitis: NEGATIVE
Chlamydia: NEGATIVE
Comment: NEGATIVE
Comment: NEGATIVE
Comment: NEGATIVE
Comment: NEGATIVE
Comment: NEGATIVE
Comment: NORMAL
Neisseria Gonorrhea: NEGATIVE
Trichomonas: NEGATIVE

## 2020-07-30 LAB — RPR: RPR Ser Ql: REACTIVE — AB

## 2020-07-30 LAB — RPR, QUANT+TP ABS (REFLEX)
Rapid Plasma Reagin, Quant: 1:2 {titer} — ABNORMAL HIGH
T Pallidum Abs: NONREACTIVE

## 2020-07-30 LAB — HIV ANTIBODY (ROUTINE TESTING W REFLEX): HIV Screen 4th Generation wRfx: NONREACTIVE

## 2020-09-12 ENCOUNTER — Other Ambulatory Visit: Payer: Self-pay

## 2020-09-12 ENCOUNTER — Ambulatory Visit
Admission: EM | Admit: 2020-09-12 | Discharge: 2020-09-12 | Disposition: A | Discharge status: Home / Self Care | Payer: BC Managed Care – PPO | Attending: Family Medicine | Admitting: Family Medicine

## 2020-09-12 ENCOUNTER — Encounter: Payer: Self-pay | Admitting: Emergency Medicine

## 2020-09-12 DIAGNOSIS — R519 Headache, unspecified: Secondary | ICD-10-CM | POA: Insufficient documentation

## 2020-09-12 LAB — POCT URINALYSIS DIP (MANUAL ENTRY)
Bilirubin, UA: NEGATIVE
Blood, UA: NEGATIVE
Glucose, UA: NEGATIVE mg/dL
Ketones, POC UA: NEGATIVE mg/dL
Leukocytes, UA: NEGATIVE
Nitrite, UA: NEGATIVE
Protein Ur, POC: 100 mg/dL — AB
Spec Grav, UA: 1.025 (ref 1.010–1.025)
Urobilinogen, UA: 1 E.U./dL
pH, UA: 6 (ref 5.0–8.0)

## 2020-09-12 LAB — POCT URINE PREGNANCY: Preg Test, Ur: NEGATIVE

## 2020-09-12 MED ORDER — NAPROXEN 375 MG PO TABS: 375.0000 mg | ORAL_TABLET | Freq: Two times a day (BID) | ORAL | 0 refills | Status: DC

## 2020-09-12 MED ORDER — METOCLOPRAMIDE HCL 10 MG PO TABS: 10.0000 mg | ORAL_TABLET | Freq: Three times a day (TID) | ORAL | 0 refills | Status: DC | PRN

## 2020-09-12 NOTE — ED Provider Notes (Signed)
[facility]    CSN: [id number] Arrival date & time: 09/12/20  7062      History   Chief Complaint No chief complaint on file.   HPI Kristin Haynes is a 20 y.o. female.   HPI Headache right side and dizziness with tinnitus both ears. Dizziness followed headache. Has eaten and drink today. Nausea present without vomiting. Normally take tylenol for headache.  Headache pain at present 5/10, initially 3/4.   Past Medical History:  Diagnosis Date  . Asthma    cough-variant asthma    Patient Active Problem List   Diagnosis Date Noted  . Dysmenorrhea 10/05/2017  . Abdominal pain 03/31/2016  . Hyperlipidemia 01/06/2016  . Vitamin D deficiency 07/20/2015  . PCOS (polycystic ovarian syndrome) 07/18/2015  . Tension headache 05/03/2015  . Migraine without aura and without status migrainosus, not intractable 05/03/2015  . 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 Rhinitis, allergic 04/30/2014  . Mild intermittent asthma 04/30/2014  . Acanthosis nigricans 04/30/2014    History reviewed. No pertinent surgical history.  OB History   No obstetric history on file.      Home Medications    Prior to Admission medications   Medication Sig Start Date End Date Taking? Authorizing Provider  metoCLOPramide (REGLAN) 10 MG tablet Take 1 tablet (10 mg total) by mouth every 8 (eight) hours as needed for nausea. 09/12/20  Yes Bing Neighbors, FNP  naproxen (NAPROSYN) 375 MG tablet Take 1 tablet (375 mg total) by mouth 2 (two) times daily. 09/12/20  Yes Bing Neighbors, FNP  albuterol (VENTOLIN HFA) 108 (90 Base) MCG/ACT inhaler INHALE 2 PUFFS INTO THE LUNGS EVERY 6 HOURS AS NEEDED FOR WHEEZE OR SHORTNESS OF BREATH 11/24/19   Verneda Skill, FNP  desogestrel-ethinyl estradiol (APRI) 0.15-30 MG-MCG tablet Take 1 tablet by mouth daily. 01/22/20   Verneda Skill, FNP  escitalopram (LEXAPRO) 10 MG tablet TAKE 1 TABLET BY MOUTH EVERY DAY 04/17/20   Alfonso Ramus T,  FNP  Vitamin D, Ergocalciferol, (DRISDOL) 1.25 MG (50000 UNIT) CAPS capsule TAKE 1 CAPSULE (50,000 UNITS TOTAL) BY MOUTH EVERY 7 (SEVEN) DAYS. 10/02/19   Verneda Skill, FNP    Family History Family History  Problem Relation Age of Onset  . Hyperlipidemia Mother   . Migraines Mother   . Asthma Maternal Grandmother   . Diabetes Maternal Grandmother   . Heart disease Maternal Grandmother        congestive heart failure  . Cancer Maternal Grandmother        breast cancer (negative testing for genetic causes)  . Heart disease Maternal Grandfather 42       MI  . Migraines Maternal Grandfather   . Asthma Sister   . Seizures Other        2 paternal 1st cousins have seizures, maternal 1st cousin had febrile seizures (resolved), MGU has seizures    Social History Social History   Tobacco Use  . Smoking status: Never Smoker  . Smokeless tobacco: Never Used  Substance Use Topics  . Alcohol use: Yes    Alcohol/week: 0.0 standard drinks    Comment: occ  . Drug use: Yes    Types: Marijuana    Comment: daily      Allergies   Other   Review of Systems Review of Systems Pertinent negatives listed in HPI   Physical Exam Triage Vital Signs ED Triage Vitals  Enc Vitals Group     BP 09/12/20 0952 115/75  Pulse Rate 09/12/20 0952 60     Resp 09/12/20 0952 16     Temp 09/12/20 0952 98.6 F (37 C)     Temp Source 09/12/20 0952 Oral     SpO2 09/12/20 0952 99 %     Weight --      Height --      Head Circumference --      Peak Flow --      Pain Score 09/12/20 1003 4     Pain Loc --      Pain Edu? --      Excl. in GC? --    No data found.  Updated Vital Signs BP 115/75 (BP Location: Right Arm)   Pulse 60   Temp 98.6 F (37 C) (Oral)   Resp 16   LMP 08/26/2020   SpO2 99%   Visual Acuity Right Eye Distance:   Left Eye Distance:   Bilateral Distance:    Right Eye Near:   Left Eye Near:    Bilateral Near:     Physical Exam  Constitutional: Patient  appears well-developed and well-nourished. No distress. HENT: Normocephalic, atraumatic, External right and left ear normal. Oropharynx is clear and moist.  Eyes: Conjunctivae and EOM are normal. PERRLA, no scleral icterus. Neck: Normal ROM. Neck supple. No JVD. No tracheal deviation. No thyromegaly. CVS: RRR, S1/S2 +, no murmurs, no gallops, no carotid bruit.  Pulmonary: Effort and breath sounds normal, no stridor, rhonchi, wheezes, rales.  Abdominal: Soft. BS +, no distension, tenderness, rebound or guarding.  Musculoskeletal: Normal range of motion. No edema and no tenderness.  Neuro: Alert. Normal reflexes, muscle tone coordination. No cranial nerve deficit. Skin: Skin is warm and dry. No rash noted. Not diaphoretic. No erythema. No pallor. Psychiatric: Normal mood and affect. Behavior, judgment, thought content normal.   UC Treatments / Results  Labs (all labs ordered are listed, but only abnormal results are displayed) Labs Reviewed  URINE CULTURE - Abnormal; Notable for the following components:      Result Value   Culture MULTIPLE SPECIES PRESENT, SUGGEST RECOLLECTION (*)    All other components within normal limits  POCT URINALYSIS DIP (MANUAL ENTRY) - Abnormal; Notable for the following components:   Protein Ur, POC =100 (*)    All other components within normal limits  POCT URINE PREGNANCY    EKG   Radiology No results found.  Procedures Procedures (including critical care time)  Medications Ordered in UC Medications - No data to display  Initial Impression / Assessment and Plan / UC Course  I have reviewed the triage vital signs and the nursing notes.  Pertinent labs & imaging results that were available during my care of the patient were reviewed by me and considered in my medical decision making (see chart for details).     Acute episodic headache, normal neurological exam.  Trial Reglan 10 mg every 8 hours as needed for headache or nausea.  Naproxen 375  twice daily.  Recommend trialing the naproxen prior to attempting relief with Reglan.  Hydrate well with fluids.  Recommended eye exam to ensure that eyestrain and is not the source of headaches.  ER precautions given.  Final Clinical Impressions(s) / UC Diagnoses   Final diagnoses:  Nonintractable episodic headache, unspecified headache type     Discharge Instructions     Recommend eye exam.  Metoclopramide as needed for nausea and headaches. Try naproxen first if headaches are mild.     ED Prescriptions  Medication Sig Dispense Auth. Provider   metoCLOPramide (REGLAN) 10 MG tablet Take 1 tablet (10 mg total) by mouth every 8 (eight) hours as needed for nausea. 30 tablet Bing Neighbors, FNP   naproxen (NAPROSYN) 375 MG tablet Take 1 tablet (375 mg total) by mouth 2 (two) times daily. 20 tablet Bing Neighbors, FNP     PDMP not reviewed this encounter.   Bing Neighbors, FNP 09/15/20 1752

## 2020-09-12 NOTE — ED Triage Notes (Signed)
Nauseate for the past couple of week, dizziness started this morning. Thinks she may have had a seizure about a month ago.  States she did she her doctor when this happened.

## 2020-09-12 NOTE — Discharge Instructions (Addendum)
Recommend eye exam.  Metoclopramide as needed for nausea and headaches. Try naproxen first if headaches are mild.

## 2020-09-14 LAB — URINE CULTURE

## 2021-02-07 ENCOUNTER — Ambulatory Visit
Admission: EM | Admit: 2021-02-07 | Discharge: 2021-02-07 | Disposition: A | Discharge status: Home / Self Care | Payer: BC Managed Care – PPO | Attending: Family Medicine | Admitting: Family Medicine

## 2021-02-07 ENCOUNTER — Other Ambulatory Visit: Payer: Self-pay

## 2021-02-07 ENCOUNTER — Encounter: Payer: Self-pay | Admitting: Emergency Medicine

## 2021-02-07 DIAGNOSIS — Z113 Encounter for screening for infections with a predominantly sexual mode of transmission: Secondary | ICD-10-CM | POA: Insufficient documentation

## 2021-02-07 DIAGNOSIS — N760 Acute vaginitis: Secondary | ICD-10-CM | POA: Insufficient documentation

## 2021-02-07 HISTORY — DX: Polycystic ovarian syndrome: E28.2

## 2021-02-07 LAB — POCT URINALYSIS DIP (MANUAL ENTRY)
Bilirubin, UA: NEGATIVE
Blood, UA: NEGATIVE
Glucose, UA: NEGATIVE mg/dL
Ketones, POC UA: NEGATIVE mg/dL
Leukocytes, UA: NEGATIVE
Nitrite, UA: NEGATIVE
Protein Ur, POC: NEGATIVE mg/dL
Spec Grav, UA: 1.025 (ref 1.010–1.025)
Urobilinogen, UA: 0.2 E.U./dL
pH, UA: 7 (ref 5.0–8.0)

## 2021-02-07 LAB — POCT URINE PREGNANCY: Preg Test, Ur: NEGATIVE

## 2021-02-07 MED ORDER — METRONIDAZOLE 500 MG PO TABS: 500.0000 mg | ORAL_TABLET | Freq: Two times a day (BID) | ORAL | 0 refills | Status: DC

## 2021-02-07 MED ORDER — FLUCONAZOLE 150 MG PO TABS: 150.0000 mg | ORAL_TABLET | Freq: Once | ORAL | 0 refills | Status: AC

## 2021-02-07 NOTE — ED Triage Notes (Signed)
Vaginal odor for the past few days. Hx of BV

## 2021-02-07 NOTE — ED Provider Notes (Signed)
[facility]    CSN: [id number] Arrival date & time: 02/07/21  1536      History   Chief Complaint No chief complaint on file.   HPI Kristin Haynes is a 20 y.o. female.   HPI Patient presents today for STD testing. History of recurrent BV.  Endorses 1 new sexual partner however unaware of any known exposure to STD.  Endorses 3 days of vaginal odor and irritation.  Past Medical History:  Diagnosis Date   Asthma    cough-variant asthma   PCOS (polycystic ovarian syndrome)     Patient Active Problem List   Diagnosis Date Noted   Dysmenorrhea 10/05/2017   Abdominal pain 03/31/2016   Hyperlipidemia 01/06/2016   Vitamin D deficiency 07/20/2015   PCOS (polycystic ovarian syndrome) 07/18/2015   Tension headache 05/03/2015   Migraine without aura and without status migrainosus, not intractable 05/03/2015   Obesity peds (BMI >=95 percentile) 04/30/2014   Rhinitis, allergic 04/30/2014   Mild intermittent asthma 04/30/2014   Acanthosis nigricans 04/30/2014    History reviewed. No pertinent surgical history.  OB History   No obstetric history on file.      Home Medications    Prior to Admission medications   Medication Sig Start Date End Date Taking? Authorizing Provider  fluconazole (DIFLUCAN) 150 MG tablet Take 1 tablet (150 mg total) by mouth once for 1 dose. Repeat if needed 02/07/21 02/07/21 Yes Bing Neighbors, FNP  metroNIDAZOLE (FLAGYL) 500 MG tablet Take 1 tablet (500 mg total) by mouth 2 (two) times daily. 02/07/21  Yes Bing Neighbors, FNP  albuterol (VENTOLIN HFA) 108 (90 Base) MCG/ACT inhaler INHALE 2 PUFFS INTO THE LUNGS EVERY 6 HOURS AS NEEDED FOR WHEEZE OR SHORTNESS OF BREATH 11/24/19   Verneda Skill, FNP  desogestrel-ethinyl estradiol (APRI) 0.15-30 MG-MCG tablet Take 1 tablet by mouth daily. 01/22/20   Verneda Skill, FNP  escitalopram (LEXAPRO) 10 MG tablet TAKE 1 TABLET BY MOUTH EVERY DAY 04/17/20   Verneda Skill, FNP   metoCLOPramide (REGLAN) 10 MG tablet Take 1 tablet (10 mg total) by mouth every 8 (eight) hours as needed for nausea. 09/12/20   Bing Neighbors, FNP  naproxen (NAPROSYN) 375 MG tablet Take 1 tablet (375 mg total) by mouth 2 (two) times daily. 09/12/20   Bing Neighbors, FNP  Vitamin D, Ergocalciferol, (DRISDOL) 1.25 MG (50000 UNIT) CAPS capsule TAKE 1 CAPSULE (50,000 UNITS TOTAL) BY MOUTH EVERY 7 (SEVEN) DAYS. 10/02/19   Verneda Skill, FNP    Family History Family History  Problem Relation Age of Onset   Hyperlipidemia Mother    Migraines Mother    Asthma Maternal Grandmother    Diabetes Maternal Grandmother    Heart disease Maternal Grandmother        congestive heart failure   Cancer Maternal Grandmother        breast cancer (negative testing for genetic causes)   Heart disease Maternal Grandfather 41       MI   Migraines Maternal Grandfather    Asthma Sister    Seizures Other        2 paternal 1st cousins have seizures, maternal 1st cousin had febrile seizures (resolved), MGU has seizures    Social History Social History   Tobacco Use   Smoking status: Never   Smokeless tobacco: Never  Substance Use Topics   Alcohol use: Yes    Alcohol/week: 0.0 standard drinks    Comment: occ   Drug use:  Yes    Types: Marijuana    Comment: daily      Allergies   Other   Review of Systems Review of Systems Pertinent negatives listed in HPI  Physical Exam Triage Vital Signs ED Triage Vitals  Enc Vitals Group     BP 02/07/21 1710 139/81     Pulse Rate 02/07/21 1710 (!) 52     Resp 02/07/21 1710 16     Temp 02/07/21 1710 98.2 F (36.8 C)     Temp Source 02/07/21 1710 Tympanic     SpO2 02/07/21 1710 98 %     Weight --      Height --      Head Circumference --      Peak Flow --      Pain Score 02/07/21 1721 0     Pain Loc --      Pain Edu? --      Excl. in GC? --    No data found.  Updated Vital Signs BP 139/81 (BP Location: Right Arm)   Pulse (!) 52    Temp 98.2 F (36.8 C) (Tympanic)   Resp 16   LMP 01/28/2021 (Exact Date)   SpO2 98%   Visual Acuity Right Eye Distance:   Left Eye Distance:   Bilateral Distance:    Right Eye Near:   Left Eye Near:    Bilateral Near:     Physical Exam General appearance: Alert, well developed, well nourished, cooperative  Head: Normocephalic, without obvious abnormality, atraumatic Respiratory: Respirations even and unlabored, normal respiratory rate Heart: Rate and rhythm normal.  Extremities: No gross deformities Skin: Skin color, texture, turgor normal. No rashes seen  Psych: Appropriate mood and affect. Neurologic: GCS 15, normal coordination, normal gait UC Treatments / Results  Labs (all labs ordered are listed, but only abnormal results are displayed) Labs Reviewed  POCT URINE PREGNANCY  POCT URINALYSIS DIP (MANUAL ENTRY)  CERVICOVAGINAL ANCILLARY ONLY    EKG   Radiology No results found.  Procedures Procedures (including critical care time)  Medications Ordered in UC Medications - No data to display  Initial Impression / Assessment and Plan / UC Course  I have reviewed the triage vital signs and the nursing notes.  Pertinent labs & imaging results that were available during my care of the patient were reviewed by me and considered in my medical decision making (see chart for details).    Vaginitis and screening for STD Treatment per discharge instructions.  Patient has no known exposure therefore will defer any additional STD treatment. Safe sex practices advised. Return precautions given. Final Clinical Impressions(s) / UC Diagnoses   Final diagnoses:  Screen for STD (sexually transmitted disease)  Vaginitis and vulvovaginitis     Discharge Instructions      Vaginal cytology will result in 1 to 2 days. Treating you for vaginitis with metronidazole and Diflucan. Take medication as prescribed.   ED Prescriptions     Medication Sig Dispense Auth.  Provider   fluconazole (DIFLUCAN) 150 MG tablet Take 1 tablet (150 mg total) by mouth once for 1 dose. Repeat if needed 2 tablet Bing Neighbors, FNP   metroNIDAZOLE (FLAGYL) 500 MG tablet Take 1 tablet (500 mg total) by mouth 2 (two) times daily. 14 tablet Bing Neighbors, FNP      PDMP not reviewed this encounter.   Bing Neighbors, FNP 02/07/21 1806

## 2021-02-07 NOTE — Discharge Instructions (Addendum)
Vaginal cytology will result in 1 to 2 days. Treating you for vaginitis with metronidazole and Diflucan. Take medication as prescribed.

## 2021-02-09 LAB — CERVICOVAGINAL ANCILLARY ONLY
Bacterial Vaginitis (gardnerella): POSITIVE — AB
Candida Glabrata: NEGATIVE
Candida Vaginitis: NEGATIVE
Chlamydia: NEGATIVE
Comment: NEGATIVE
Comment: NEGATIVE
Comment: NEGATIVE
Comment: NEGATIVE
Comment: NEGATIVE
Comment: NORMAL
Neisseria Gonorrhea: NEGATIVE
Trichomonas: NEGATIVE

## 2021-03-22 ENCOUNTER — Encounter: Payer: Self-pay | Admitting: Emergency Medicine

## 2021-03-22 ENCOUNTER — Other Ambulatory Visit: Payer: Self-pay

## 2021-03-22 ENCOUNTER — Ambulatory Visit
Admission: EM | Admit: 2021-03-22 | Discharge: 2021-03-22 | Disposition: A | Discharge status: Home / Self Care | Payer: Self-pay | Attending: Internal Medicine | Admitting: Internal Medicine

## 2021-03-22 DIAGNOSIS — N760 Acute vaginitis: Secondary | ICD-10-CM | POA: Insufficient documentation

## 2021-03-22 MED ORDER — FLUCONAZOLE 150 MG PO TABS: 150.0000 mg | ORAL_TABLET | Freq: Once | ORAL | 0 refills | Status: AC

## 2021-03-22 NOTE — ED Triage Notes (Signed)
White vaginal discharge x 1 week.  Wants to be checked for STDs.

## 2021-03-22 NOTE — Discharge Instructions (Addendum)
Please take medications as prescribed We will call you with recommendation if labs are abnormal Return to urgent care if symptoms worsen Avoid sexual intercourse until lab results are available

## 2021-03-24 ENCOUNTER — Telehealth (HOSPITAL_COMMUNITY): Payer: Self-pay | Admitting: Emergency Medicine

## 2021-03-24 LAB — CERVICOVAGINAL ANCILLARY ONLY
Bacterial Vaginitis (gardnerella): POSITIVE — AB
Candida Glabrata: NEGATIVE
Candida Vaginitis: NEGATIVE
Chlamydia: NEGATIVE
Comment: NEGATIVE
Comment: NEGATIVE
Comment: NEGATIVE
Comment: NEGATIVE
Comment: NEGATIVE
Comment: NORMAL
Neisseria Gonorrhea: NEGATIVE
Trichomonas: NEGATIVE

## 2021-03-24 MED ORDER — METRONIDAZOLE 500 MG PO TABS: 500.0000 mg | ORAL_TABLET | Freq: Two times a day (BID) | ORAL | 0 refills | Status: DC

## 2021-03-24 NOTE — ED Provider Notes (Addendum)
[facility]    CSN: [id number] Arrival date & time: 03/22/21  1220      History   Chief Complaint No chief complaint on file.   HPI Kristin Haynes is a 20 y.o. female comes to the urgent care with 1 week history of whitish vaginal discharge.  Patient denies any dysuria, urgency or frequency.  Patient is sexually active with 1 partner.  She denies any abdominal pain.  No nausea or vomiting.  HPI  Past Medical History:  Diagnosis Date   Asthma    cough-variant asthma   PCOS (polycystic ovarian syndrome)     Patient Active Problem List   Diagnosis Date Noted   Dysmenorrhea 10/05/2017   Abdominal pain 03/31/2016   Hyperlipidemia 01/06/2016   Vitamin D deficiency 07/20/2015   PCOS (polycystic ovarian syndrome) 07/18/2015   Tension headache 05/03/2015   Migraine without aura and without status migrainosus, not intractable 05/03/2015   Obesity peds (BMI >=95 percentile) 04/30/2014   Rhinitis, allergic 04/30/2014   Mild intermittent asthma 04/30/2014   Acanthosis nigricans 04/30/2014    History reviewed. No pertinent surgical history.  OB History   No obstetric history on file.      Home Medications    Prior to Admission medications   Medication Sig Start Date End Date Taking? Authorizing Provider  albuterol (VENTOLIN HFA) 108 (90 Base) MCG/ACT inhaler INHALE 2 PUFFS INTO THE LUNGS EVERY 6 HOURS AS NEEDED FOR WHEEZE OR SHORTNESS OF BREATH 11/24/19   Verneda Skill, FNP  desogestrel-ethinyl estradiol (APRI) 0.15-30 MG-MCG tablet Take 1 tablet by mouth daily. 01/22/20   Verneda Skill, FNP  escitalopram (LEXAPRO) 10 MG tablet TAKE 1 TABLET BY MOUTH EVERY DAY 04/17/20   Verneda Skill, FNP  metoCLOPramide (REGLAN) 10 MG tablet Take 1 tablet (10 mg total) by mouth every 8 (eight) hours as needed for nausea. 09/12/20   Bing Neighbors, FNP  metroNIDAZOLE (FLAGYL) 500 MG tablet Take 1 tablet (500 mg total) by mouth 2 (two) times daily. 03/24/21    Merrilee Jansky, MD  naproxen (NAPROSYN) 375 MG tablet Take 1 tablet (375 mg total) by mouth 2 (two) times daily. 09/12/20   Bing Neighbors, FNP  Vitamin D, Ergocalciferol, (DRISDOL) 1.25 MG (50000 UNIT) CAPS capsule TAKE 1 CAPSULE (50,000 UNITS TOTAL) BY MOUTH EVERY 7 (SEVEN) DAYS. 10/02/19   Verneda Skill, FNP    Family History Family History  Problem Relation Age of Onset   Hyperlipidemia Mother    Migraines Mother    Asthma Maternal Grandmother    Diabetes Maternal Grandmother    Heart disease Maternal Grandmother        congestive heart failure   Cancer Maternal Grandmother        breast cancer (negative testing for genetic causes)   Heart disease Maternal Grandfather 84       MI   Migraines Maternal Grandfather    Asthma Sister    Seizures Other        2 paternal 1st cousins have seizures, maternal 1st cousin had febrile seizures (resolved), [facility]U has seizures    Social History Social History   Tobacco Use   Smoking status: Never   Smokeless tobacco: Never  Substance Use Topics   Alcohol use: Yes    Alcohol/week: 0.0 standard drinks    Comment: occ   Drug use: Yes    Types: Marijuana    Comment: daily      Allergies   Other  Review of Systems Review of Systems  Genitourinary:  Positive for vaginal discharge. Negative for hematuria, urgency and vaginal bleeding.  Neurological: Negative.     Physical Exam Triage Vital Signs ED Triage Vitals  Enc Vitals Group     BP 03/22/21 1252 126/79     Pulse Rate 03/22/21 1252 75     Resp 03/22/21 1252 18     Temp 03/22/21 1252 99 F (37.2 C)     Temp Source 03/22/21 1252 Oral     SpO2 03/22/21 1252 99 %     Weight --      Height --      Head Circumference --      Peak Flow --      Pain Score 03/22/21 1253 0     Pain Loc --      Pain Edu? --      Excl. in [place]? --    No data found.  Updated Vital Signs BP 126/79 (BP Location: Right Arm)   Pulse 75   Temp 99 F (37.2 C) (Oral)   Resp 18   LMP  02/23/2021 (Exact Date)   SpO2 99%   Visual Acuity Right Eye Distance:   Left Eye Distance:   Bilateral Distance:    Right Eye Near:   Left Eye Near:    Bilateral Near:     Physical Exam Vitals and nursing note reviewed.  Pulmonary:     Effort: Pulmonary effort is normal.     Breath sounds: Normal breath sounds.  Abdominal:     General: Abdomen is flat. Bowel sounds are normal.  Genitourinary:    Comments:      UC Treatments / Results  Labs (all labs ordered are listed, but only abnormal results are displayed)   EKG   Radiology No results found.  Procedures Procedures (including critical care time)  Medications Ordered in UC Medications - No data to display  Initial Impression / Assessment and Plan / UC Course  I have reviewed the triage vital signs and the nursing notes.  Pertinent labs & imaging results that were available during my care of the patient were reviewed by me and considered in my medical decision making (see chart for details).     1.  Acute vaginitis: Cervical vaginal swab for GC/chlamydia/trichomonas/vaginal yeast/BV Fluconazole 150 mg x 1 dose to be repeated in 72 hours if there is no improvement in symptoms Will call patient with recommendations if labs are abnormal Abstain from sexual intercourse until lab results are available.   Final Clinical Impressions(s) / UC Diagnoses   Final diagnoses:  Acute vaginitis     Discharge Instructions      Please take medications as prescribed We will call you with recommendation if labs are abnormal Return to urgent care if symptoms worsen Avoid sexual intercourse until lab results are available     ED Prescriptions     Medication Sig Dispense Auth. Provider   fluconazole (DIFLUCAN) 150 MG tablet Take 1 tablet (150 mg total) by mouth once for 1 dose. Please repeat in 72 hours if no improvement in symptoms 2 tablet Basma Buchner, Britta Mccreedy, MD      PDMP not reviewed this encounter.    Merrilee Jansky, MD 03/24/21 1728    Merrilee Jansky, MD 03/24/21 [phone]

## 2021-09-10 ENCOUNTER — Ambulatory Visit
Admission: EM | Admit: 2021-09-10 | Discharge: 2021-09-10 | Disposition: A | Discharge status: Home / Self Care | Payer: Self-pay | Attending: Family Medicine | Admitting: Family Medicine

## 2021-09-10 ENCOUNTER — Encounter: Payer: Self-pay | Admitting: Emergency Medicine

## 2021-09-10 DIAGNOSIS — N3000 Acute cystitis without hematuria: Secondary | ICD-10-CM | POA: Insufficient documentation

## 2021-09-10 DIAGNOSIS — R45851 Suicidal ideations: Secondary | ICD-10-CM | POA: Insufficient documentation

## 2021-09-10 LAB — POCT URINALYSIS DIP (MANUAL ENTRY)
Bilirubin, UA: NEGATIVE
Blood, UA: NEGATIVE
Glucose, UA: NEGATIVE mg/dL
Ketones, POC UA: NEGATIVE mg/dL
Nitrite, UA: NEGATIVE
Protein Ur, POC: NEGATIVE mg/dL
Spec Grav, UA: 1.02 (ref 1.010–1.025)
Urobilinogen, UA: 0.2 E.U./dL
pH, UA: 7 (ref 5.0–8.0)

## 2021-09-10 LAB — POCT URINE PREGNANCY
Preg Test, Ur: NEGATIVE
Preg Test, Ur: NEGATIVE

## 2021-09-10 MED ORDER — NITROFURANTOIN MONOHYD MACRO 100 MG PO CAPS: 100.0000 mg | ORAL_CAPSULE | Freq: Two times a day (BID) | ORAL | 0 refills | Status: AC

## 2021-09-10 NOTE — ED Provider Notes (Signed)
?[facility] ? ? ? ?CSN: [id number] ?Arrival date & time: 09/10/21  1210 ? ? ?  ? ?History   ?Chief Complaint ?No chief complaint on file. ? ? ?HPI ?Kristin Haynes is a 21 y.o. female.  ? ?HPI ?Patient is here for suspected UTI. She reports one day of lower abdominal pain, back pain, and diarrhea. She is experiencing dysuria. No history of recurrent UTI. She developed lower abdominal pain last evening. Reports constipation a few days prior and subsequently has had 3 episodes of loose stool. No nausea. Low back pain. Patient's last menstrual period was 08/28/2021. ? ? ? ?Passive suicidal ideations ?Patient has a history of adjustment disorder and was seen in 2021 by a therapist. Patient is not actively receiving any mental health services. Previously taking escitalopram in 2021. Patient is able to contract for safety.  Patient did agree to follow-up at the outpatient mental health urgent care.  She resides at home with her father therefore she is not alone.  She is not currently actively having any suicidal thoughts.  She endorses a history of self injures behavior and reports she did cut over a week ago.  She reports she does not share this information with any of her family member or friends that she does feel is an immature response to her emotions. ? ?Past Medical History:  ?Diagnosis Date  ? Asthma   ? cough-variant asthma  ? PCOS (polycystic ovarian syndrome)   ? ? ?Patient Active Problem List  ? Diagnosis Date Noted  ? Dysmenorrhea 10/05/2017  ? Abdominal pain 03/31/2016  ? Hyperlipidemia 01/06/2016  ? Vitamin D deficiency 07/20/2015  ? PCOS (polycystic ovarian syndrome) 07/18/2015  ? Tension headache 05/03/2015  ? Migraine without aura and without status migrainosus, not intractable 05/03/2015  ? Obesity peds (BMI >=95 percentile) 04/30/2014  ? Rhinitis, allergic 04/30/2014  ? Mild intermittent asthma 04/30/2014  ? Acanthosis nigricans 04/30/2014  ? ? ?No past surgical history on file. ? ?OB  History   ?No obstetric history on file. ?  ? ? ? ?Home Medications   ? ?Prior to Admission medications   ?Medication Sig Start Date End Date Taking? Authorizing Provider  ?albuterol (VENTOLIN HFA) 108 (90 Base) MCG/ACT inhaler INHALE 2 PUFFS INTO THE LUNGS EVERY 6 HOURS AS NEEDED FOR WHEEZE OR SHORTNESS OF BREATH 11/24/19   Trude Mcburney, FNP  ?desogestrel-ethinyl estradiol (APRI) 0.15-30 MG-MCG tablet Take 1 tablet by mouth daily. 01/22/20   Trude Mcburney, FNP  ?escitalopram (LEXAPRO) 10 MG tablet TAKE 1 TABLET BY MOUTH EVERY DAY 04/17/20   Trude Mcburney, FNP  ?metoCLOPramide (REGLAN) 10 MG tablet Take 1 tablet (10 mg total) by mouth every 8 (eight) hours as needed for nausea. 09/12/20   Scot Jun, FNP  ?metroNIDAZOLE (FLAGYL) 500 MG tablet Take 1 tablet (500 mg total) by mouth 2 (two) times daily. 03/24/21   LampteyMyrene Galas, MD  ?naproxen (NAPROSYN) 375 MG tablet Take 1 tablet (375 mg total) by mouth 2 (two) times daily. 09/12/20   Scot Jun, FNP  ?Vitamin D, Ergocalciferol, (DRISDOL) 1.25 MG (50000 UNIT) CAPS capsule TAKE 1 CAPSULE (50,000 UNITS TOTAL) BY MOUTH EVERY 7 (SEVEN) DAYS. 10/02/19   Trude Mcburney, FNP  ? ? ?Family History ?Family History  ?Problem Relation Age of Onset  ? Hyperlipidemia Mother   ? Migraines Mother   ? Asthma Maternal Grandmother   ? Diabetes Maternal Grandmother   ? Heart disease Maternal Grandmother   ?  congestive heart failure  ? Cancer Maternal Grandmother   ?     breast cancer (negative testing for genetic causes)  ? Heart disease Maternal Grandfather 42  ?     MI  ? Migraines Maternal Grandfather   ? Asthma Sister   ? Seizures Other   ?     2 paternal 1st cousins have seizures, maternal 1st cousin had febrile seizures (resolved), [facility] has seizures  ? ? ?Social History ?Social History  ? ?Tobacco Use  ? Smoking status: Never  ? Smokeless tobacco: Never  ?Substance Use Topics  ? Alcohol use: Yes  ?  Alcohol/week: 0.0 standard drinks  ?  Comment:  occ  ? Drug use: Yes  ?  Types: Marijuana  ?  Comment: daily   ? ? ? ?Allergies   ?Other ? ? ?Review of Systems ?Review of Systems ?Pertinent negatives listed in HPI  ? ?Physical Exam ?Triage Vital Signs ?ED Triage Vitals  ?Enc Vitals Group  ?   BP   ?   Pulse   ?   Resp   ?   Temp   ?   Temp src   ?   SpO2   ?   Weight   ?   Height   ?   Head Circumference   ?   Peak Flow   ?   Pain Score   ?   Pain Loc   ?   Pain Edu?   ?   Excl. in [place]?   ? ?No data found. ? ?Updated Vital Signs ?LMP 08/28/2021  ? ?Visual Acuity ?Right Eye Distance:   ?Left Eye Distance:   ?Bilateral Distance:   ? ?Right Eye Near:   ?Left Eye Near:    ?Bilateral Near:    ? ?Physical Exam ?Constitutional:   ?   Appearance: She is well-developed.  ?HENT:  ?   Head: Normocephalic.  ?Cardiovascular:  ?   Rate and Rhythm: Normal rate and regular rhythm.  ?Pulmonary:  ?   Effort: Pulmonary effort is normal.  ?   Breath sounds: Normal breath sounds.  ?Neurological:  ?   Mental Status: She is alert.  ?Psychiatric:     ?   Attention and Perception: Attention normal.     ?   Mood and Affect: Mood is depressed. Affect is flat.     ?   Speech: Speech normal.     ?   Behavior: Behavior is cooperative.     ?   Thought Content: Thought content is not delusional. Thought content includes suicidal ideation. Thought content does not include homicidal or suicidal plan.     ?   Cognition and Memory: Cognition normal.     ?   Judgment: Judgment normal.  ?   Comments: Fleeting thoughts of suicide, last SI x 1 week ago. ?Self injurious cutting, last cut 1 week ago.  ? ?UC Treatments / Results  ?Labs ?(all labs ordered are listed, but only abnormal results are displayed) ?Labs Reviewed - No data to display ? ?EKG ? ? ?Radiology ?No results found. ? ?Procedures ?Procedures (including critical care time) ? ?Medications Ordered in UC ?Medications - No data to display ? ?Initial Impression / Assessment and Plan / UC Course  ?I have reviewed the triage vital signs and the  nursing notes. ? ?Pertinent labs & imaging results that were available during my care of the patient were reviewed by me and considered in my medical decision making (see chart for  details). ? ?  ?Passive suicidal ideations, information given to follow-up at [facility].  ?Patient was able to contract for safety.  Patient advised that she will go to the walk-in clinic at [facility].  Patient advised that she will call the 24-hour suicidal line if any of her thoughts recur. Treating for UTI based on symptoms while awaiting urine culture results.  ?Final Clinical Impressions(s) / UC Diagnoses  ? ?Final diagnoses:  ?Passive suicidal ideations  ?Acute cystitis without hematuria  ? ? ? ?Discharge Instructions   ? ?  ?Your urine culture will result within 3 days.  However start Macrobid 100 mg 3 times daily for treatment of suspected urinary tract infection.  I also prescribed you 1 dose of Diflucan to prevent yeast related to treatment with an antibiotic. ?As we discussed I have provided you with the walk-in clinic hours at behavioral health urgent care.  Please follow-up with them next week however if you have any additional thoughts of self-harm you can follow-up with them as a walk-in at any point they are open 24/7. ? ?Therapy Walk-in Hours ? ?Monday-Wednesday: 8 AM until slots are full ? ?Friday: 1 PM to 5 PM ? ?For Monday to Wednesday, it is recommended that patients arrive between 7:30 AM and 7:45 AM because patients will be seen in the order of arrival.  For Friday, we ask that patients arrive between 12 PM to 12:30 PM.  Go to the second floor on arrival and check in. ? ?Availability is limited; therefore, patients may not be seen on the same day. ? ?Medication management walk-ins: ? ?Monday to Friday: 8 AM to 11 AM. ? ?It is recommended that patients arrive by 7:30 AM to 7:45 AM because patients will be seen in the order of arrival.  Go to the second floor on arrival and check in. ? ?Availability is limited; therefore,  patients may not be seen on the same day. ? ? ? ?ED Prescriptions   ? ? Medication Sig Dispense Auth. Provider  ? nitrofurantoin, macrocrystal-monohydrate, (MACROBID) 100 MG capsule Take 1 capsule (100 mg total) by

## 2021-09-10 NOTE — Discharge Instructions (Signed)
Your urine culture will result within 3 days.  However start Macrobid 100 mg 3 times daily for treatment of suspected urinary tract infection.  I also prescribed you 1 dose of Diflucan to prevent yeast related to treatment with an antibiotic. ?As we discussed I have provided you with the walk-in clinic hours at behavioral health urgent care.  Please follow-up with them next week however if you have any additional thoughts of self-harm you can follow-up with them as a walk-in at any point they are open 24/7. ? ?Therapy Walk-in Hours ? ?Monday-Wednesday: 8 AM until slots are full ? ?Friday: 1 PM to 5 PM ? ?For Monday to Wednesday, it is recommended that patients arrive between 7:30 AM and 7:45 AM because patients will be seen in the order of arrival.  For Friday, we ask that patients arrive between 12 PM to 12:30 PM.  Go to the second floor on arrival and check in. ? ?**Availability is limited; therefore, patients may not be seen on the same day.** ? ?Medication management walk-ins: ? ?Monday to Friday: 8 AM to 11 AM. ? ?It is recommended that patients arrive by 7:30 AM to 7:45 AM because patients will be seen in the order of arrival.  Go to the second floor on arrival and check in. ? ?Availability is limited; therefore, patients may not be seen on the same day. ?

## 2021-09-10 NOTE — ED Triage Notes (Signed)
Pt presents with lower abdominal pain, back pain and diarrhea since yesterday ?

## 2021-09-12 LAB — URINE CULTURE

## 2023-01-01 ENCOUNTER — Encounter: Payer: Self-pay | Admitting: Obstetrics and Gynecology

## 2023-01-01 ENCOUNTER — Other Ambulatory Visit: Payer: Self-pay

## 2023-01-01 ENCOUNTER — Ambulatory Visit (INDEPENDENT_AMBULATORY_CARE_PROVIDER_SITE_OTHER): Payer: 59 | Admitting: Obstetrics and Gynecology

## 2023-01-01 ENCOUNTER — Other Ambulatory Visit (HOSPITAL_COMMUNITY)
Admission: RE | Admit: 2023-01-01 | Discharge: 2023-01-01 | Disposition: A | Discharge status: Home / Self Care | Payer: 59 | Source: Ambulatory Visit | Attending: Family Medicine | Admitting: Family Medicine

## 2023-01-01 VITALS — BP 124/85 | HR 72 | Ht 64.0 in | Wt 159.7 lb

## 2023-01-01 DIAGNOSIS — R87612 Low grade squamous intraepithelial lesion on cytologic smear of cervix (LGSIL): Secondary | ICD-10-CM | POA: Insufficient documentation

## 2023-01-01 DIAGNOSIS — Z113 Encounter for screening for infections with a predominantly sexual mode of transmission: Secondary | ICD-10-CM | POA: Insufficient documentation

## 2023-01-01 DIAGNOSIS — J4520 Mild intermittent asthma, uncomplicated: Secondary | ICD-10-CM

## 2023-01-01 DIAGNOSIS — F4323 Adjustment disorder with mixed anxiety and depressed mood: Secondary | ICD-10-CM | POA: Diagnosis not present

## 2023-01-01 DIAGNOSIS — Z01419 Encounter for gynecological examination (general) (routine) without abnormal findings: Secondary | ICD-10-CM | POA: Insufficient documentation

## 2023-01-01 DIAGNOSIS — B3731 Acute candidiasis of vulva and vagina: Secondary | ICD-10-CM | POA: Insufficient documentation

## 2023-01-01 DIAGNOSIS — G479 Sleep disorder, unspecified: Secondary | ICD-10-CM

## 2023-01-01 DIAGNOSIS — F410 Panic disorder [episodic paroxysmal anxiety] without agoraphobia: Secondary | ICD-10-CM

## 2023-01-01 DIAGNOSIS — Z01411 Encounter for gynecological examination (general) (routine) with abnormal findings: Secondary | ICD-10-CM | POA: Diagnosis not present

## 2023-01-01 MED ORDER — ALBUTEROL SULFATE HFA 108 (90 BASE) MCG/ACT IN AERS: 2.0000 | INHALATION_SPRAY | RESPIRATORY_TRACT | 2 refills | Status: AC | PRN

## 2023-01-01 MED ORDER — ESCITALOPRAM OXALATE 10 MG PO TABS: 10.0000 mg | ORAL_TABLET | Freq: Every day | ORAL | 2 refills | Status: AC

## 2023-01-01 NOTE — Progress Notes (Signed)
GYNECOLOGY ANNUAL PREVENTATIVE CARE ENCOUNTER NOTE  History:     Kristin Haynes is a 22 y.o. G0P0000 female here for a routine annual gynecologic exam.  Current complaints: none.   Denies abnormal vaginal bleeding, discharge, pelvic pain, problems with intercourse or other gynecologic concerns.    Gynecologic History Patient's last menstrual period was 12/20/2022 (exact date). Contraception: none Last Pap: 3/21. Results were: normal with negative HPV Last mammogram: n/a  Obstetric History OB History  Gravida Para Term Preterm AB Living  0 0 0 0 0 0  SAB IAB Ectopic Multiple Live Births  0 0 0 0 0    Past Medical History:  Diagnosis Date   Asthma    cough-variant asthma   PCOS (polycystic ovarian syndrome)     History reviewed. No pertinent surgical history.  No current outpatient medications on file prior to visit.   No current facility-administered medications on file prior to visit.    Allergies  Allergen Reactions   Other     Seasonal Allergies       Social History:  reports that she has never smoked. She has never used smokeless tobacco. She reports current alcohol use. She reports current drug use. Drug: Marijuana.  Family History  Problem Relation Age of Onset   Hyperlipidemia Mother    Migraines Mother    Asthma Maternal Grandmother    Diabetes Maternal Grandmother    Heart disease Maternal Grandmother        congestive heart failure   Cancer Maternal Grandmother        breast cancer (negative testing for genetic causes)   Heart disease Maternal Grandfather 63       MI   Migraines Maternal Grandfather    Asthma Sister    Seizures Other        2 paternal 1st cousins have seizures, maternal 1st cousin had febrile seizures (resolved), MGU has seizures    The following portions of the patient's history were reviewed and updated as appropriate: allergies, current medications, past family history, past medical history, past social history, past  surgical history and problem list.  Review of Systems Pertinent items noted in HPI and remainder of comprehensive ROS otherwise negative.  Physical Exam:  BP 124/85   Pulse 72   Ht 5\' 4"  (1.626 m)   Wt 159 lb 11.2 oz (72.4 kg)   LMP 12/20/2022 (Exact Date) Comment: lasted 6 days; heavy to light flow  BMI 27.41 kg/m  CONSTITUTIONAL: Well-developed, well-nourished female in no acute distress.  HENT:  Normocephalic, atraumatic, External right and left ear normal. Oropharynx is clear and moist EYES: Conjunctivae and EOM are normal.  NECK: Normal range of motion, supple, no masses.  Normal thyroid.  SKIN: Skin is warm and dry. No rash noted. Not diaphoretic. No erythema. No pallor. MUSCULOSKELETAL: Normal range of motion. No tenderness.  No cyanosis, clubbing, or edema.  2+ distal pulses. NEUROLOGIC: Alert and oriented to person, place, and time. Normal reflexes, muscle tone coordination.  PSYCHIATRIC: Normal mood and affect. Normal behavior. Normal judgment and thought content. CARDIOVASCULAR: Normal heart rate noted, regular rhythm RESPIRATORY: Clear to auscultation bilaterally. Effort and breath sounds normal, no problems with respiration noted. BREASTS: deferred ABDOMEN: Soft, no distention noted.  No tenderness, rebound or guarding.  PELVIC: Normal appearing external genitalia and urethral meatus; normal appearing vaginal mucosa and cervix.  No abnormal discharge noted.  Pap smear obtained. Vaginal swab obtained.  Normal uterine size, no other palpable masses, no uterine or  adnexal tenderness.  Performed in the presence of a chaperone.   Assessment and Plan:    1. Well woman exam with routine gynecological exam Normal annual exam  - Cytology - PAP( [facility]) - RPR - Hepatitis C Antibody - Hepatitis B Surface AntiGEN - HIV Antibody (routine testing w rflx) - Cervicovaginal ancillary only( [place])  2. Routine screening for STI (sexually transmitted infection) Per pt  request  3. Mild intermittent asthma, uncomplicated Pt requested inhaler  - albuterol (VENTOLIN HFA) 108 (90 Base) MCG/ACT inhaler; Inhale 2 puffs into the lungs every 4 (four) hours as needed for wheezing or shortness of breath.  Dispense: 8.5 g; Refill: 2 - Ambulatory referral to Internal Medicine  4. Adjustment disorder with mixed anxiety and depressed mood Pt has taken lexapro before and felt it was of benefit  - escitalopram (LEXAPRO) 10 MG tablet; Take 1 tablet (10 mg total) by mouth daily.  Dispense: 90 tablet; Refill: 2 - Ambulatory referral to Internal Medicine  5. Panic attacks Non recent  - escitalopram (LEXAPRO) 10 MG tablet; Take 1 tablet (10 mg total) by mouth daily.  Dispense: 90 tablet; Refill: 2 - Ambulatory referral to Internal Medicine  6. Sleep disturbance  - escitalopram (LEXAPRO) 10 MG tablet; Take 1 tablet (10 mg total) by mouth daily.  Dispense: 90 tablet; Refill: 2  Will follow up results of pap smear and manage accordingly. Routine preventative health maintenance measures emphasized. Please refer to After Visit Summary for other counseling recommendations.   Refer sent to internal medicine to establish PCP    F/u in 1 year Mariel Aloe, MD, FACOG Obstetrician & Gynecologist, [employer] for [employer], [facility]

## 2023-01-02 LAB — RPR, QUANT+TP ABS (REFLEX)
Rapid Plasma Reagin, Quant: 1:1 {titer} — ABNORMAL HIGH
T Pallidum Abs: NONREACTIVE

## 2023-01-02 LAB — CERVICOVAGINAL ANCILLARY ONLY
Bacterial Vaginitis (gardnerella): NEGATIVE
Candida Glabrata: NEGATIVE
Candida Vaginitis: POSITIVE — AB
Chlamydia: NEGATIVE
Comment: NEGATIVE
Comment: NEGATIVE
Comment: NEGATIVE
Comment: NEGATIVE
Comment: NEGATIVE
Comment: NORMAL
Neisseria Gonorrhea: NEGATIVE
Trichomonas: NEGATIVE

## 2023-01-02 LAB — HEPATITIS C ANTIBODY: Hep C Virus Ab: NONREACTIVE

## 2023-01-02 LAB — RPR: RPR Ser Ql: REACTIVE — AB

## 2023-01-02 LAB — HEPATITIS B SURFACE ANTIGEN: Hepatitis B Surface Ag: NEGATIVE

## 2023-01-02 LAB — HIV ANTIBODY (ROUTINE TESTING W REFLEX): HIV Screen 4th Generation wRfx: NONREACTIVE

## 2023-01-03 LAB — CYTOLOGY - PAP
Chlamydia: NEGATIVE
Comment: NEGATIVE
Comment: NEGATIVE
Comment: NORMAL
Neisseria Gonorrhea: NEGATIVE
Trichomonas: NEGATIVE

## 2023-01-04 ENCOUNTER — Encounter: Payer: Self-pay | Admitting: Obstetrics and Gynecology

## 2023-01-07 ENCOUNTER — Ambulatory Visit
Admission: RE | Admit: 2023-01-07 | Discharge: 2023-01-07 | Disposition: A | Discharge status: Home / Self Care | Payer: 59 | Source: Ambulatory Visit | Attending: Emergency Medicine | Admitting: Emergency Medicine

## 2023-01-07 VITALS — BP 100/68 | HR 60 | Temp 98.5°F | Resp 17

## 2023-01-07 DIAGNOSIS — B3731 Acute candidiasis of vulva and vagina: Secondary | ICD-10-CM | POA: Diagnosis present

## 2023-01-07 DIAGNOSIS — N898 Other specified noninflammatory disorders of vagina: Secondary | ICD-10-CM | POA: Diagnosis present

## 2023-01-07 MED ORDER — FLUCONAZOLE 150 MG PO TABS: 150.0000 mg | ORAL_TABLET | Freq: Every day | ORAL | 0 refills | Status: DC

## 2023-01-07 NOTE — ED Provider Notes (Signed)
[facility]    CSN: [id number] Arrival date & time: 01/07/23  1157      History   Chief Complaint Chief Complaint  Patient presents with   Vaginal Itching    I went to a doctor last week for a pap, seen i had lsil abnormalities, as well as yeast. He never followed up or sent in prescription to pharmacy. - Entered by patient    HPI Kristin Haynes is a 22 y.o. female.  Patient presents with concern for yeast infection.  She was seen by her GYN on 01/01/2023 and tested positive for yeast but no treatment has been called in yet.  She has small amount of white vaginal discharge.  She denies fever, chills, abdominal pain, dysuria, pelvic pain, or other symptoms.  Her medical history includes dysmenorrhea, PCOS, migraine headache, asthma.  The history is provided by the patient and medical records.    Past Medical History:  Diagnosis Date   Asthma    cough-variant asthma   PCOS (polycystic ovarian syndrome)     Patient Active Problem List   Diagnosis Date Noted   Dysmenorrhea 10/05/2017   Abdominal pain 03/31/2016   Hyperlipidemia 01/06/2016   Vitamin D deficiency 07/20/2015   PCOS (polycystic ovarian syndrome) 07/18/2015   Tension headache 05/03/2015   Migraine without aura and without status migrainosus, not intractable 05/03/2015   Obesity peds (BMI >=95 percentile) 04/30/2014   Rhinitis, allergic 04/30/2014   Mild intermittent asthma 04/30/2014   Acanthosis nigricans 04/30/2014    History reviewed. No pertinent surgical history.  OB History     Gravida  0   Para  0   Term  0   Preterm  0   AB  0   Living  0      SAB  0   IAB  0   Ectopic  0   Multiple  0   Live Births  0            Home Medications    Prior to Admission medications   Medication Sig Start Date End Date Taking? Authorizing Provider  fluconazole (DIFLUCAN) 150 MG tablet Take 1 tablet (150 mg total) by mouth daily. Take one tablet today.  May repeat in 3 days.  01/07/23  Yes Mickie Bail, NP  albuterol (VENTOLIN HFA) 108 (90 Base) MCG/ACT inhaler Inhale 2 puffs into the lungs every 4 (four) hours as needed for wheezing or shortness of breath. 01/01/23   Warden Fillers, MD  escitalopram (LEXAPRO) 10 MG tablet Take 1 tablet (10 mg total) by mouth daily. 01/01/23   Warden Fillers, MD    Family History Family History  Problem Relation Age of Onset   Hyperlipidemia Mother    Migraines Mother    Asthma Maternal Grandmother    Diabetes Maternal Grandmother    Heart disease Maternal Grandmother        congestive heart failure   Cancer Maternal Grandmother        breast cancer (negative testing for genetic causes)   Heart disease Maternal Grandfather 71       MI   Migraines Maternal Grandfather    Asthma Sister    Seizures Other        2 paternal 1st cousins have seizures, maternal 1st cousin had febrile seizures (resolved), MGU has seizures    Social History Social History   Tobacco Use   Smoking status: Never   Smokeless tobacco: Never  Vaping Use   Vaping  status: Every Day  Substance Use Topics   Alcohol use: Yes    Alcohol/week: 0.0 standard drinks of alcohol    Comment: occ   Drug use: Yes    Types: Marijuana    Comment: daily      Allergies   Other   Review of Systems Review of Systems  Constitutional:  Negative for chills and fever.  Gastrointestinal:  Negative for abdominal pain, nausea and vomiting.  Genitourinary:  Positive for vaginal discharge. Negative for dysuria, flank pain, frequency, hematuria and pelvic pain.  Skin:  Negative for color change and rash.     Physical Exam Triage Vital Signs ED Triage Vitals  Encounter Vitals Group     BP      Systolic BP Percentile      Diastolic BP Percentile      Pulse      Resp      Temp      Temp src      SpO2      Weight      Height      Head Circumference      Peak Flow      Pain Score      Pain Loc      Pain Education      Exclude from Growth Chart     No data found.  Updated Vital Signs BP 100/68   Pulse 60   Temp 98.5 F (36.9 C)   Resp 17   LMP 12/20/2022 (Exact Date) Comment: lasted 6 days; heavy to light flow  SpO2 98%   Visual Acuity Right Eye Distance:   Left Eye Distance:   Bilateral Distance:    Right Eye Near:   Left Eye Near:    Bilateral Near:     Physical Exam Constitutional:      General: She is not in acute distress. HENT:     Mouth/Throat:     Mouth: Mucous membranes are moist.  Cardiovascular:     Rate and Rhythm: Normal rate and regular rhythm.     Heart sounds: Normal heart sounds.  Pulmonary:     Effort: Pulmonary effort is normal. No respiratory distress.     Breath sounds: Normal breath sounds.  Abdominal:     General: Bowel sounds are normal.     Palpations: Abdomen is soft.     Tenderness: There is no abdominal tenderness. There is no right CVA tenderness, left CVA tenderness, guarding or rebound.  Skin:    General: Skin is warm and dry.  Neurological:     Mental Status: She is alert.  Psychiatric:        Mood and Affect: Mood normal.        Behavior: Behavior normal.      UC Treatments / Results  Labs (all labs ordered are listed, but only abnormal results are displayed) Labs Reviewed - No data to display  EKG   Radiology No results found.  Procedures Procedures (including critical care time)  Medications Ordered in UC Medications - No data to display  Initial Impression / Assessment and Plan / UC Course  I have reviewed the triage vital signs and the nursing notes.  Pertinent labs & imaging results that were available during my care of the patient were reviewed by me and considered in my medical decision making (see chart for details).   Candidal vaginitis, vaginal discharge.  Patient obtained vaginal self swab for testing.  Treating with Diflucan.  Discussed that we will  call if test results are positive.  Instructed patient to abstain from sexual activity for at  least 7 days.  Instructed her to follow-up with her gynecologist for abnormal pap result.  Patient agrees to plan of care.    Final Clinical Impressions(s) / UC Diagnoses   Final diagnoses:  Candidal vaginitis  Vaginal discharge     Discharge Instructions      Take the Diflucan as directed.   Your vaginal tests are pending.  If your test results are positive, we will call you.  Do not have sexual activity for at least 7 days.    Follow up with your gynecologist.       ED Prescriptions     Medication Sig Dispense Auth. Provider   fluconazole (DIFLUCAN) 150 MG tablet Take 1 tablet (150 mg total) by mouth daily. Take one tablet today.  May repeat in 3 days. 2 tablet Mickie Bail, NP      PDMP not reviewed this encounter.   Mickie Bail, NP 01/07/23 1235

## 2023-01-07 NOTE — Discharge Instructions (Addendum)
Take the Diflucan as directed.   Your vaginal tests are pending.  If your test results are positive, we will call you.  Do not have sexual activity for at least 7 days.    Follow up with your gynecologist.

## 2023-01-07 NOTE — ED Triage Notes (Signed)
Patient to Urgent Care- reports that she was seen by her gyn last week and tested positive for yeast but did not receive a prescription. Attempted to contact her gyn multiple times.   Denies any current symptoms. Has had some nausea.

## 2023-01-11 ENCOUNTER — Ambulatory Visit: Payer: 59 | Admitting: Obstetrics and Gynecology

## 2023-07-03 ENCOUNTER — Ambulatory Visit
Admission: EM | Admit: 2023-07-03 | Discharge: 2023-07-03 | Disposition: A | Discharge status: Home / Self Care | Payer: 59 | Attending: Emergency Medicine | Admitting: Emergency Medicine

## 2023-07-03 DIAGNOSIS — N898 Other specified noninflammatory disorders of vagina: Secondary | ICD-10-CM | POA: Diagnosis not present

## 2023-07-03 LAB — POCT URINE PREGNANCY: Preg Test, Ur: NEGATIVE

## 2023-07-03 MED ORDER — METRONIDAZOLE 500 MG PO TABS: 500.0000 mg | ORAL_TABLET | Freq: Two times a day (BID) | ORAL | 0 refills | Status: AC

## 2023-07-03 NOTE — Discharge Instructions (Addendum)
Take the metronidazole as directed.  Your vaginal tests are pending.  If your test for BV is negative, stop taking the medication.    Follow-up with your primary care provider if your symptoms are not improving.

## 2023-07-03 NOTE — ED Provider Notes (Addendum)
[facility]    CSN: [id number] Arrival date & time: 07/03/23  5188      History   Chief Complaint Chief Complaint  Patient presents with   Vaginal Discharge    HPI Kristin Haynes is a 23 y.o. female.  Patient presents with vaginal discharge x 2 days.  The discharge is similar to previous episode of bacterial vaginitis.  No fever, dysuria, hematuria, abdominal pain, flank pain, pelvic pain.  No treatments at home.  She denies concern for STD and declines testing.    The history is provided by the patient and medical records.    Past Medical History:  Diagnosis Date   Asthma    cough-variant asthma   PCOS (polycystic ovarian syndrome)     Patient Active Problem List   Diagnosis Date Noted   Dysmenorrhea 10/05/2017   Abdominal pain 03/31/2016   Hyperlipidemia 01/06/2016   Vitamin D deficiency 07/20/2015   PCOS (polycystic ovarian syndrome) 07/18/2015   Tension headache 05/03/2015   Migraine without aura and without status migrainosus, not intractable 05/03/2015   Obesity peds (BMI >=95 percentile) 04/30/2014   Rhinitis, allergic 04/30/2014   Mild intermittent asthma 04/30/2014   Acanthosis nigricans 04/30/2014    History reviewed. No pertinent surgical history.  OB History     Gravida  0   Para  0   Term  0   Preterm  0   AB  0   Living  0      SAB  0   IAB  0   Ectopic  0   Multiple  0   Live Births  0            Home Medications    Prior to Admission medications   Medication Sig Start Date End Date Taking? Authorizing Provider  metroNIDAZOLE (FLAGYL) 500 MG tablet Take 1 tablet (500 mg total) by mouth 2 (two) times daily. 07/03/23  Yes Mickie Bail, NP  albuterol (VENTOLIN HFA) 108 (90 Base) MCG/ACT inhaler Inhale 2 puffs into the lungs every 4 (four) hours as needed for wheezing or shortness of breath. 01/01/23   Warden Fillers, MD  escitalopram (LEXAPRO) 10 MG tablet Take 1 tablet (10 mg total) by mouth daily. 01/01/23    Warden Fillers, MD  fluconazole (DIFLUCAN) 150 MG tablet Take 1 tablet (150 mg total) by mouth daily. Take one tablet today.  May repeat in 3 days. Patient not taking: Reported on 07/03/2023 01/07/23   Mickie Bail, NP    Family History Family History  Problem Relation Age of Onset   Hyperlipidemia Mother    Migraines Mother    Asthma Maternal Grandmother    Diabetes Maternal Grandmother    Heart disease Maternal Grandmother        congestive heart failure   Cancer Maternal Grandmother        breast cancer (negative testing for genetic causes)   Heart disease Maternal Grandfather 103       MI   Migraines Maternal Grandfather    Asthma Sister    Seizures Other        2 paternal 1st cousins have seizures, maternal 1st cousin had febrile seizures (resolved), MGU has seizures    Social History Social History   Tobacco Use   Smoking status: Never   Smokeless tobacco: Never  Vaping Use   Vaping status: Every Day  Substance Use Topics   Alcohol use: Yes    Alcohol/week: 0.0 standard drinks of  alcohol    Comment: occ   Drug use: Yes    Types: Marijuana    Comment: daily      Allergies   Other   Review of Systems Review of Systems  Constitutional:  Negative for chills and fever.  Gastrointestinal:  Negative for abdominal pain.  Genitourinary:  Positive for vaginal discharge. Negative for dysuria, flank pain, hematuria and pelvic pain.     Physical Exam Triage Vital Signs ED Triage Vitals  Encounter Vitals Group     BP      Systolic BP Percentile      Diastolic BP Percentile      Pulse      Resp      Temp      Temp src      SpO2      Weight      Height      Head Circumference      Peak Flow      Pain Score      Pain Loc      Pain Education      Exclude from Growth Chart    No data found.  Updated Vital Signs BP 122/74   Pulse 72   Temp 97.7 F (36.5 C)   Resp 16   LMP 06/22/2023   SpO2 98%   Visual Acuity Right Eye Distance:   Left Eye  Distance:   Bilateral Distance:    Right Eye Near:   Left Eye Near:    Bilateral Near:     Physical Exam Constitutional:      General: She is not in acute distress. HENT:     Mouth/Throat:     Mouth: Mucous membranes are moist.  Cardiovascular:     Rate and Rhythm: Normal rate and regular rhythm.  Pulmonary:     Effort: Pulmonary effort is normal. No respiratory distress.  Abdominal:     General: Bowel sounds are normal.     Palpations: Abdomen is soft.     Tenderness: There is no abdominal tenderness. There is no right CVA tenderness, left CVA tenderness, guarding or rebound.  Genitourinary:    Comments: Patient declines GU exam. Neurological:     Mental Status: She is alert.      UC Treatments / Results  Labs (all labs ordered are listed, but only abnormal results are displayed) Labs Reviewed  POCT URINE PREGNANCY - Normal  CERVICOVAGINAL ANCILLARY ONLY    EKG   Radiology No results found.  Procedures Procedures (including critical care time)  Medications Ordered in UC Medications - No data to display  Initial Impression / Assessment and Plan / UC Course  I have reviewed the triage vital signs and the nursing notes.  Pertinent labs & imaging results that were available during my care of the patient were reviewed by me and considered in my medical decision making (see chart for details).   Vaginal discharge.  Patient obtained vaginal self swab for testing.  Treating with metronidazole as patient states her current vaginal discharge is similar to previous episode of bacterial vaginitis.  Instructed patient to stop taking this medication if her test results are negative for bacterial vaginitis.  Instructed patient to abstain from sexual activity for at least 7 days.  Instructed her to follow-up with her PCP or gynecologist if her symptoms are not improving.  Patient agrees to plan of care.    Final Clinical Impressions(s) / UC Diagnoses   Final diagnoses:   Vaginal discharge  Discharge Instructions      Take the metronidazole as directed.  Your vaginal tests are pending.  If your test for BV is negative, stop taking the medication.    Follow-up with your primary care provider if your symptoms are not improving.      ED Prescriptions     Medication Sig Dispense Auth. Provider   metroNIDAZOLE (FLAGYL) 500 MG tablet Take 1 tablet (500 mg total) by mouth 2 (two) times daily. 14 tablet Mickie Bail, NP      PDMP not reviewed this encounter.   Mickie Bail, NP 07/03/23 1050    Mickie Bail, NP 07/03/23 1051

## 2023-07-03 NOTE — ED Triage Notes (Signed)
Patient to Urgent Care with complaints of vaginal discharge.  Reports symptoms started two days ago. Denies any concerns for STDs. Hx of BV.

## 2023-07-04 LAB — CERVICOVAGINAL ANCILLARY ONLY
Bacterial Vaginitis (gardnerella): POSITIVE — AB
Candida Glabrata: NEGATIVE
Candida Vaginitis: NEGATIVE
Comment: NEGATIVE
Comment: NEGATIVE
Comment: NEGATIVE

## 2023-10-18 ENCOUNTER — Ambulatory Visit
Admission: EM | Admit: 2023-10-18 | Discharge: 2023-10-18 | Disposition: A | Discharge status: Home / Self Care | Payer: Self-pay | Attending: Emergency Medicine | Admitting: Emergency Medicine

## 2023-10-18 DIAGNOSIS — Z3202 Encounter for pregnancy test, result negative: Secondary | ICD-10-CM | POA: Insufficient documentation

## 2023-10-18 DIAGNOSIS — N898 Other specified noninflammatory disorders of vagina: Secondary | ICD-10-CM

## 2023-10-18 LAB — POCT URINE PREGNANCY: Preg Test, Ur: NEGATIVE

## 2023-10-18 MED ORDER — FLUCONAZOLE 150 MG PO TABS: 150.0000 mg | ORAL_TABLET | Freq: Every day | ORAL | 0 refills | Status: AC

## 2023-10-18 NOTE — Discharge Instructions (Addendum)
 Your vaginal tests are pending.  If your test results are positive, we will call you.  Do not have sexual activity for at least 7 days.    Take the Diflucan  as directed.    Follow-up with your primary care provider if your symptoms are not improving.

## 2023-10-18 NOTE — ED Triage Notes (Signed)
 Patient to Urgent Care with complaints of vaginal itching/ smell.  Symptoms x4 days. Using a generic monistat w/o relief.   Possible concern for an STD.

## 2023-10-18 NOTE — ED Provider Notes (Signed)
 Kristin Haynes    CSN: [id number] Arrival date & time: 10/18/23  1641      History   Chief Complaint Chief Complaint  Patient presents with   Vaginal Itching    HPI Kristin Haynes is a 23 y.o. female.  Patient presents with 4-day history of vaginal itching and malodorous vaginal discharge.  She states the symptoms are similar to previous yeast infection.  She attempted treatment with OTC Monistat without relief.  She denies fever, chills, rash, pelvic pain, dysuria, hematuria, abdominal pain.  She requests STD testing also.  The history is provided by the patient and medical records.    Past Medical History:  Diagnosis Date   Asthma    cough-variant asthma   PCOS (polycystic ovarian syndrome)     Patient Active Problem List   Diagnosis Date Noted   Dysmenorrhea 10/05/2017   Abdominal pain 03/31/2016   Hyperlipidemia 01/06/2016   Vitamin D  deficiency 07/20/2015   PCOS (polycystic ovarian syndrome) 07/18/2015   Tension headache 05/03/2015   Migraine without aura and without status migrainosus, not intractable 05/03/2015   Obesity peds (BMI >=95 percentile) 04/30/2014   Rhinitis, allergic 04/30/2014   Mild intermittent asthma 04/30/2014   Acanthosis nigricans 04/30/2014    History reviewed. No pertinent surgical history.  OB History     Gravida  0   Para  0   Term  0   Preterm  0   AB  0   Living  0      SAB  0   IAB  0   Ectopic  0   Multiple  0   Live Births  0            Home Medications    Prior to Admission medications   Medication Sig Start Date End Date Taking? Authorizing Provider  fluconazole  (DIFLUCAN ) 150 MG tablet Take 1 tablet (150 mg total) by mouth daily. Take one tablet today.  May repeat in 3 days. 10/18/23  Yes Wellington Half, NP  albuterol  (VENTOLIN  HFA) 108 (90 Base) MCG/ACT inhaler Inhale 2 puffs into the lungs every 4 (four) hours as needed for wheezing or shortness of breath. 01/01/23   Abigail Abler, MD   escitalopram  (LEXAPRO ) 10 MG tablet Take 1 tablet (10 mg total) by mouth daily. 01/01/23   Abigail Abler, MD  metroNIDAZOLE  (FLAGYL ) 500 MG tablet Take 1 tablet (500 mg total) by mouth 2 (two) times daily. Patient not taking: Reported on 10/18/2023 07/03/23   Wellington Half, NP    Family History Family History  Problem Relation Age of Onset   Hyperlipidemia Mother    Migraines Mother    Asthma Maternal Grandmother    Diabetes Maternal Grandmother    Heart disease Maternal Grandmother        congestive heart failure   Cancer Maternal Grandmother        breast cancer (negative testing for genetic causes)   Heart disease Maternal Grandfather 67       MI   Migraines Maternal Grandfather    Asthma Sister    Seizures Other        2 paternal 1st cousins have seizures, maternal 1st cousin had febrile seizures (resolved), MGU has seizures    Social History Social History   Tobacco Use   Smoking status: Never   Smokeless tobacco: Never  Vaping Use   Vaping status: Every Day  Substance Use Topics   Alcohol use: Yes    Alcohol/week:  0.0 standard drinks of alcohol    Comment: occ   Drug use: Yes    Types: Marijuana    Comment: daily      Allergies   Other   Review of Systems Review of Systems  Constitutional:  Negative for chills and fever.  Gastrointestinal:  Negative for abdominal pain.  Genitourinary:  Positive for vaginal discharge. Negative for dysuria, flank pain, hematuria and pelvic pain.  Skin:  Negative for color change and rash.     Physical Exam Triage Vital Signs ED Triage Vitals  Encounter Vitals Group     BP 10/18/23 1715 113/77     Systolic BP Percentile --      Diastolic BP Percentile --      Pulse Rate 10/18/23 1715 74     Resp 10/18/23 1715 18     Temp 10/18/23 1715 97.7 F (36.5 C)     Temp src --      SpO2 10/18/23 1715 98 %     Weight --      Height --      Head Circumference --      Peak Flow --      Pain Score 10/18/23 1710 0      Pain Loc --      Pain Education --      Exclude from Growth Chart --    No data found.  Updated Vital Signs BP 113/77   Pulse 74   Temp 97.7 F (36.5 C)   Resp 18   LMP 09/25/2023   SpO2 98%   Visual Acuity Right Eye Distance:   Left Eye Distance:   Bilateral Distance:    Right Eye Near:   Left Eye Near:    Bilateral Near:     Physical Exam Constitutional:      General: She is not in acute distress. Cardiovascular:     Rate and Rhythm: Normal rate and regular rhythm.  Pulmonary:     Effort: Pulmonary effort is normal. No respiratory distress.  Abdominal:     General: Bowel sounds are normal.     Palpations: Abdomen is soft.     Tenderness: There is no abdominal tenderness. There is no right CVA tenderness, left CVA tenderness, guarding or rebound.  Genitourinary:    Comments: Patient declines GU exam. Neurological:     Mental Status: She is alert.      UC Treatments / Results  Labs (all labs ordered are listed, but only abnormal results are displayed) Labs Reviewed  POCT URINE PREGNANCY  CERVICOVAGINAL ANCILLARY ONLY    EKG   Radiology No results found.  Procedures Procedures (including critical care time)  Medications Ordered in UC Medications - No data to display  Initial Impression / Assessment and Plan / UC Course  I have reviewed the triage vital signs and the nursing notes.  Pertinent labs & imaging results that were available during my care of the patient were reviewed by me and considered in my medical decision making (see chart for details).    Vaginal itching, negative pregnancy test, vaginal discharge.  Patient obtained self swab for testing.  Treating with Diflucan .  Discussed that we will call if test results are positive.  Instructed patient to abstain from sexual activity for at least 7 days.  Instructed her to follow-up with her PCP or gynecologist if her symptoms are not improving.  Patient agrees to plan of care.   Final  Clinical Impressions(s) / UC Diagnoses   Final  diagnoses:  Vaginal itching  Negative pregnancy test  Vaginal discharge     Discharge Instructions      Your vaginal tests are pending.  If your test results are positive, we will call you.  Do not have sexual activity for at least 7 days.    Take the Diflucan  as directed.    Follow-up with your primary care provider if your symptoms are not improving.      ED Prescriptions     Medication Sig Dispense Auth. Provider   fluconazole  (DIFLUCAN ) 150 MG tablet Take 1 tablet (150 mg total) by mouth daily. Take one tablet today.  May repeat in 3 days. 2 tablet Wellington Half, NP      PDMP not reviewed this encounter.   Wellington Half, NP 10/18/23 [phone]

## 2023-10-19 ENCOUNTER — Telehealth (HOSPITAL_COMMUNITY): Payer: Self-pay

## 2023-10-19 LAB — CERVICOVAGINAL ANCILLARY ONLY
Bacterial Vaginitis (gardnerella): POSITIVE — AB
Candida Glabrata: NEGATIVE
Candida Vaginitis: NEGATIVE
Chlamydia: NEGATIVE
Comment: NEGATIVE
Comment: NEGATIVE
Comment: NEGATIVE
Comment: NEGATIVE
Comment: NEGATIVE
Comment: NORMAL
Neisseria Gonorrhea: NEGATIVE
Trichomonas: NEGATIVE

## 2023-10-19 MED ORDER — METRONIDAZOLE 500 MG PO TABS: 500.0000 mg | ORAL_TABLET | Freq: Two times a day (BID) | ORAL | 0 refills | Status: AC

## 2023-10-19 NOTE — Telephone Encounter (Signed)
 Per protocol, pt requires tx with metronidazole. Rx sent to pharmacy on file.

## 2023-11-15 ENCOUNTER — Telehealth: Payer: Self-pay

## 2023-11-15 NOTE — Telephone Encounter (Signed)
 Error   Copied from CRM [id number]. Topic: General - Call Back - No Documentation >> Nov 15, 2023 12:17 PM Kristin Haynes wrote: Patient is calling to check on the status of a job application that she submitted 2 days ago call back at

## 2024-03-12 ENCOUNTER — Ambulatory Visit: Payer: Self-pay | Admitting: Family Medicine
